# Patient Record
Sex: Female | Born: 1995 | Race: Black or African American | Hispanic: No | Marital: Single | Smoking: Never smoker
Health system: Southern US, Community
[De-identification: ages and names within clinical notes are randomized; demographics above are authoritative.]

## PROBLEM LIST (undated history)

## (undated) ENCOUNTER — Emergency Department (HOSPITAL_COMMUNITY): Payer: BLUE CROSS/BLUE SHIELD

## (undated) DIAGNOSIS — R569 Unspecified convulsions: Secondary | ICD-10-CM

## (undated) DIAGNOSIS — R008 Other abnormalities of heart beat: Secondary | ICD-10-CM

## (undated) DIAGNOSIS — Z889 Allergy status to unspecified drugs, medicaments and biological substances status: Secondary | ICD-10-CM

## (undated) DIAGNOSIS — F32A Depression, unspecified: Secondary | ICD-10-CM

## (undated) DIAGNOSIS — F411 Generalized anxiety disorder: Secondary | ICD-10-CM

## (undated) DIAGNOSIS — F329 Major depressive disorder, single episode, unspecified: Secondary | ICD-10-CM

## (undated) DIAGNOSIS — E785 Hyperlipidemia, unspecified: Secondary | ICD-10-CM

## (undated) DIAGNOSIS — F410 Panic disorder [episodic paroxysmal anxiety] without agoraphobia: Secondary | ICD-10-CM

## (undated) DIAGNOSIS — T7840XA Allergy, unspecified, initial encounter: Secondary | ICD-10-CM

## (undated) DIAGNOSIS — M797 Fibromyalgia: Secondary | ICD-10-CM

## (undated) DIAGNOSIS — F419 Anxiety disorder, unspecified: Secondary | ICD-10-CM

## (undated) DIAGNOSIS — R002 Palpitations: Secondary | ICD-10-CM

## (undated) DIAGNOSIS — J45909 Unspecified asthma, uncomplicated: Secondary | ICD-10-CM

## (undated) HISTORY — DX: Allergy, unspecified, initial encounter: T78.40XA

## (undated) HISTORY — DX: Major depressive disorder, single episode, unspecified: F32.9

## (undated) HISTORY — DX: Anxiety disorder, unspecified: F41.9

## (undated) HISTORY — DX: Unspecified asthma, uncomplicated: J45.909

## (undated) HISTORY — DX: Hyperlipidemia, unspecified: E78.5

## (undated) HISTORY — DX: Depression, unspecified: F32.A

---
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
## 2016-09-09 DIAGNOSIS — F411 Generalized anxiety disorder: Secondary | ICD-10-CM | POA: Insufficient documentation

## 2016-09-24 DIAGNOSIS — F410 Panic disorder [episodic paroxysmal anxiety] without agoraphobia: Secondary | ICD-10-CM | POA: Insufficient documentation

## 2016-12-17 ENCOUNTER — Emergency Department (HOSPITAL_COMMUNITY)
Admission: EM | Admit: 2016-12-17 | Discharge: 2016-12-18 | Disposition: A | Discharge status: Home / Self Care | Payer: 59 | Attending: Emergency Medicine | Admitting: Emergency Medicine

## 2016-12-17 ENCOUNTER — Encounter (HOSPITAL_COMMUNITY): Payer: Self-pay

## 2016-12-17 DIAGNOSIS — R008 Other abnormalities of heart beat: Secondary | ICD-10-CM | POA: Insufficient documentation

## 2016-12-17 DIAGNOSIS — R002 Palpitations: Secondary | ICD-10-CM | POA: Diagnosis present

## 2016-12-17 DIAGNOSIS — Z79899 Other long term (current) drug therapy: Secondary | ICD-10-CM | POA: Diagnosis not present

## 2016-12-17 DIAGNOSIS — R0602 Shortness of breath: Secondary | ICD-10-CM | POA: Diagnosis not present

## 2016-12-17 LAB — RAPID URINE DRUG SCREEN, HOSP PERFORMED
Amphetamines: NOT DETECTED
BENZODIAZEPINES: NOT DETECTED
Barbiturates: NOT DETECTED
COCAINE: NOT DETECTED
Opiates: NOT DETECTED
Tetrahydrocannabinol: NOT DETECTED

## 2016-12-17 LAB — CBC WITH DIFFERENTIAL/PLATELET
BASOS ABS: 0 10*3/uL (ref 0.0–0.1)
Basophils Relative: 0 %
EOS PCT: 3 %
Eosinophils Absolute: 0.2 10*3/uL (ref 0.0–0.7)
HCT: 38.8 % (ref 36.0–46.0)
Hemoglobin: 13.8 g/dL (ref 12.0–15.0)
Lymphocytes Relative: 40 %
Lymphs Abs: 2.9 10*3/uL (ref 0.7–4.0)
MCH: 31.2 pg (ref 26.0–34.0)
MCHC: 35.6 g/dL (ref 30.0–36.0)
MCV: 87.6 fL (ref 78.0–100.0)
MONO ABS: 0.6 10*3/uL (ref 0.1–1.0)
Monocytes Relative: 9 %
Neutro Abs: 3.4 10*3/uL (ref 1.7–7.7)
Neutrophils Relative %: 48 %
PLATELETS: 295 10*3/uL (ref 150–400)
RBC: 4.43 MIL/uL (ref 3.87–5.11)
RDW: 12.6 % (ref 11.5–15.5)
WBC: 7.2 10*3/uL (ref 4.0–10.5)

## 2016-12-17 LAB — PREGNANCY, URINE: PREG TEST UR: NEGATIVE

## 2016-12-17 NOTE — ED Triage Notes (Signed)
Pt brought in by EMS from home pt states " my heart is racing" Per EMS pt denies CP at this time. Pt EKG showed occasional PVC's/NSR. Pt denies SOB     BP 130/82   HR 80 RR 20 O2 Sat 97% RA

## 2016-12-17 NOTE — ED Notes (Signed)
Bed: WA05 Expected date:  Expected time:  Means of arrival:  Comments: ems 

## 2016-12-17 NOTE — ED Notes (Addendum)
Pt reports that she felt that her heart was beating fast and hard for approx. 10 minutes, pt states that she did feel as though her breath was being taken away with symptoms. Pt report  that she was sitting down when she felt the symptoms  Pt states that in the past she has had chest pain. Pt denies N/V/D, and new medications.

## 2016-12-17 NOTE — ED Provider Notes (Signed)
[facility]-EMERGENCY DEPT Provider Note   CSN: [id number] Arrival date & time: 12/17/16  2213  By signing my name below, I, Diona BrownerJennifer Gorman, attest that this documentation has been prepared under the direction and in the presence of Horton, Mayer Maskerourtney F, MD. Electronically Signed: Diona BrownerJennifer Gorman, ED Scribe. 12/17/16. 11:19 PM.  History   Chief Complaint Chief Complaint  Patient presents with  . Palpitations    HPI Kristine Soto is a 21 y.o. female who presents to the Emergency Department complaining of palpitations that started ~ 9 pm. Associated sx include SOB. Pt reports that she still feels like her heart is beating funny. Pt reports she has had CP all her life, but currently denies CP. No new medication. No drug or alcohol use. No increased caffeine intake. Hasn't seen a cardiologist in the past. No hx of blood clots. No recent travel. No chance of being pregnant. Pt takes Citalopram daily.  She doesn't take herbal or dietary supplements. Pt denies CP, nausea, vomiting, and fever.   The history is provided by the patient. No language interpreter was used.    Past Medical History:  Diagnosis Date  . Multiple allergies   . Seizures (HCC)     There are no active problems to display for this patient.   History reviewed. No pertinent surgical history.  OB History    No data available       Home Medications    Prior to Admission medications   Medication Sig Start Date End Date Taking? Authorizing Provider  ALPRAZolam (XANAX) 0.25 MG tablet Take 0.25 mg by mouth daily as needed. 09/26/16  Yes [provider]  citalopram (CELEXA) 10 MG tablet Take 10 mg by mouth daily. 11/24/16  Yes [provider]  albuterol (PROVENTIL HFA;VENTOLIN HFA) 108 (90 Base) MCG/ACT inhaler Inhale 2 puffs into the lungs every 4 (four) hours as needed for shortness of breath.    [provider]  EPINEPHrine (EPIPEN) 0.3 mg/0.3 mL DEVI Inject 0.3 mLs (0.3 mg total) into the muscle as  needed (use as ). 11/30/10   Doug SouJacubowitz, Sam, MD  predniSONE (DELTASONE) 10 MG tablet Take 2 tablets (20 mg total) by mouth daily. Patient not taking: Reported on 12/17/2016 05/02/11   Lorre NickAllen, Anthony, MD    Family History History reviewed. No pertinent family history.  Social History Social History  Substance Use Topics  . Smoking status: Never Smoker  . Smokeless tobacco: Never Used  . Alcohol use No     Allergies   Peanut oil; Penicillins; Egg white  [albumen, egg]; Fish allergy; and Fish-derived products   Review of Systems Review of Systems  Constitutional: Negative for fever.  Respiratory: Positive for shortness of breath.   Cardiovascular: Positive for palpitations. Negative for chest pain and leg swelling.  Gastrointestinal: Negative for nausea and vomiting.  All other systems reviewed and are negative.    Physical Exam Updated Vital Signs BP (!) 141/96 (BP Location: Left Arm)   Pulse 72   Temp 98.2 F (36.8 C) (Oral)   Resp 17   Ht 5\' 6"  (1.676 m)   Wt 81.6 kg (180 lb)   SpO2 99%   BMI 29.05 kg/m   Physical Exam  Constitutional: She is oriented to person, place, and time. She appears well-developed and well-nourished.  Overweight, no acute distress  HENT:  Head: Normocephalic and atraumatic.  Cardiovascular: Regular rhythm and normal heart sounds.   Regularly irregular rhythm  Pulmonary/Chest: Effort normal and breath sounds normal. No respiratory  distress. She has no wheezes.  Abdominal: Soft. There is no tenderness.  Musculoskeletal: She exhibits no edema.  Neurological: She is alert and oriented to person, place, and time.  Skin: Skin is warm and dry.  Psychiatric: She has a normal mood and affect.  Nursing note and vitals reviewed.    ED Treatments / Results  DIAGNOSTIC STUDIES: Oxygen Saturation is 99% on RA, normal by my interpretation.   COORDINATION OF CARE: 11:19 PM-Discussed next steps with pt which includes following up with a  cardiologist. Pt verbalized understanding and is agreeable with the plan.   Labs (all labs ordered are listed, but only abnormal results are displayed) Labs Reviewed  CBC WITH DIFFERENTIAL/PLATELET  BASIC METABOLIC PANEL  MAGNESIUM  RAPID URINE DRUG SCREEN, HOSP PERFORMED  PREGNANCY, URINE    EKG  EKG Interpretation  Date/Time:  Wednesday December 17 2016 22:34:12 EDT Ventricular Rate:  79 PR Interval:    QRS Duration: 85 QT Interval:  371 QTC Calculation: 426 R Axis:   89 Text Interpretation:  Sinus rhythm Ventricular trigeminy Prolonged PR interval No prior for comparison Confirmed by Ross MarcusHorton, Courtney ([id number]) on 12/17/2016 10:57:55 PM       Radiology No results found.  Procedures Procedures (including critical care time)  Medications Ordered in ED Medications - No data to display   Initial Impression / Assessment and Plan / ED Course  I have reviewed the triage vital signs and the nursing notes.  Pertinent labs & imaging results that were available during my care of the patient were reviewed by me and considered in my medical decision making (see chart for details).     Patient presents with palpitations. She is nontoxic-appearing. No associated symptoms. EKG shows trigeminy. Basic labwork obtained. No specific metabolic derangements noted including magnesium. Patient denies any chest pain or shortness of breath. She is PERC negative.  She is likely symptomatic from frequent PVCs. Will provide follow-up for cardiology. This is likely benign. Discussed with patient limiting caffeine intake.  After history, exam, and medical workup I feel the patient has been appropriately medically screened and is safe for discharge home. Pertinent diagnoses were discussed with the patient. Patient was given return precautions.   Final Clinical Impressions(s) / ED Diagnoses   Final diagnoses:  Palpitations  Trigeminy    New Prescriptions New Prescriptions   No medications on file    I personally performed the services described in this documentation, which was scribed in my presence. The recorded information has been reviewed and is accurate.    Shon BatonHorton, Courtney F, MD 12/18/16 [phone]

## 2016-12-18 LAB — BASIC METABOLIC PANEL
ANION GAP: 10 (ref 5–15)
BUN: 13 mg/dL (ref 6–20)
CO2: 24 mmol/L (ref 22–32)
Calcium: 9.2 mg/dL (ref 8.9–10.3)
Chloride: 103 mmol/L (ref 101–111)
Creatinine, Ser: 0.67 mg/dL (ref 0.44–1.00)
GLUCOSE: 80 mg/dL (ref 65–99)
POTASSIUM: 3.7 mmol/L (ref 3.5–5.1)
SODIUM: 137 mmol/L (ref 135–145)

## 2016-12-18 LAB — MAGNESIUM: MAGNESIUM: 1.8 mg/dL (ref 1.7–2.4)

## 2016-12-18 NOTE — Discharge Instructions (Signed)
You were seen today for palpitations. You are in a rhythm call trigeminy. Every third beat is a ventricular beat. Your workup here is reassuring. You need follow-up with cardiology to determine if you need any further workup. If you have any new or worsening symptoms you should be reevaluated.

## 2016-12-19 ENCOUNTER — Ambulatory Visit (INDEPENDENT_AMBULATORY_CARE_PROVIDER_SITE_OTHER): Payer: 59 | Admitting: Cardiology

## 2016-12-19 ENCOUNTER — Encounter: Payer: Self-pay | Admitting: Cardiology

## 2016-12-19 DIAGNOSIS — F419 Anxiety disorder, unspecified: Secondary | ICD-10-CM

## 2016-12-19 DIAGNOSIS — R002 Palpitations: Secondary | ICD-10-CM | POA: Diagnosis not present

## 2016-12-19 DIAGNOSIS — G4733 Obstructive sleep apnea (adult) (pediatric): Secondary | ICD-10-CM | POA: Diagnosis not present

## 2016-12-19 DIAGNOSIS — R0789 Other chest pain: Secondary | ICD-10-CM

## 2016-12-19 NOTE — Patient Instructions (Signed)
Medication Instructions:  Your physician recommends that you continue on your current medications as directed. Please refer to the Current Medication list given to you today.   Labwork: Your physician recommends that you return for lab work in: today. Lipid   Testing/Procedures: Your physician has recommended that you wear a holter monitor. Holter monitors are medical devices that record the heart's electrical activity. Doctors most often use these monitors to diagnose arrhythmias. Arrhythmias are problems with the speed or rhythm of the heartbeat. The monitor is a small, portable device. You can wear one while you do your normal daily activities. This is usually used to diagnose what is causing palpitations/syncope (passing out).  Your physician has requested that you have an echocardiogram. Echocardiography is a painless test that uses sound waves to create images of your heart. It provides your doctor with information about the size and shape of your heart and how well your heart's chambers and valves are working. This procedure takes approximately one hour. There are no restrictions for this procedure.  Your physician has recommended that you have a sleep study. This test records several body functions during sleep, including: brain activity, eye movement, oxygen and carbon dioxide blood levels, heart rate and rhythm, breathing rate and rhythm, the flow of air through your mouth and nose, snoring, body muscle movements, and chest and belly movement.    Follow-Up: Your physician recommends that you schedule a follow-up appointment in: 1 month   Any Other Special Instructions Will Be Listed Below (If Applicable).     If you need a refill on your cardiac medications before your next appointment, please call your pharmacy.

## 2016-12-19 NOTE — Progress Notes (Signed)
Cardiology Consultation:    Date:  12/19/2016   ID:  [name], DOB [birth year], MRN [id number]  PCP:  Laqueta DueFurr, Kristine M., MD  Cardiologist:  Kristine Balsamobert Krasowski, MD   Referring MD: Laqueta DueFurr, Kristine M., MD   Chief Complaint  Patient presents with  . Palpitations  . Chest Pain  Having palpitations  History of Present Illness:    Kristine Soto is a 21 y.o. female who is being seen today for the evaluation of Palpitations at the request of Kristine Soto, Tor NettersSara M., MD. Patient is 21 years old woman who recently end up going to the emergency room because of palpitations. She felt her heart skipping her breath was taking away for a split second. She did have EKG with rhythm strip done there which showed some PVCs. Luckily she has the recording with her.. She said it never had situation like this before. Entire sensation of palpitation lasted for a few hours and went away with no trouble. Since that time she does not have any more palpitations. She does have history of long-standing chest pains. She described the pain as located in the middle of the chest sharp stabbing type lasting for up to 20 seconds not related to exertion. She does not exercise on the radial basis however when she walks and she woke up to 10,000 steps a day she does have no difficulties. Does not have any symptoms while walking. Does not have any family history of premature coronary artery disease, no history of sudden cardiac death in the family.  Past Medical History:  Diagnosis Date  . Multiple allergies   . Seizures (HCC)     History reviewed. No pertinent surgical history.  Current Medications: Current Meds  Medication Sig  . albuterol (PROVENTIL HFA;VENTOLIN HFA) 108 (90 Base) MCG/ACT inhaler Inhale 2 puffs into the lungs every 4 (four) hours as needed for shortness of breath.  . ALPRAZolam (XANAX) 0.25 MG tablet Take 0.25 mg by mouth daily as needed.  . citalopram (CELEXA) 10 MG tablet Take 10 mg by mouth daily.  Marland Kitchen.  EPINEPHrine (EPIPEN) 0.3 mg/0.3 mL DEVI Inject 0.3 mLs (0.3 mg total) into the muscle as needed (use as ).     Allergies:   Peanut oil; Penicillins; Egg white  [albumen, egg]; Fish allergy; and Fish-derived products   Social History   Social History  . Marital status: Single    Spouse name: N/A  . Number of children: N/A  . Years of education: N/A   Social History Main Topics  . Smoking status: Never Smoker  . Smokeless tobacco: Never Used  . Alcohol use No  . Drug use: No  . Sexual activity: Not Asked   Other Topics Concern  . None   Social History Narrative  . None     Family History: The patient's family history includes Hyperlipidemia in her father; Thyroid disease in her mother. ROS:   Please see the history of present illness.    All 14 point review of systems negative except as described per history of present illness.  EKGs/Labs/Other Studies Reviewed:    The following studies were reviewed today: EKG done in the emergency room which showed normal sinus rhythm, normal. Interval, normal., Some PVCs, nonspecific ST segment changes.  Recent Labs: 12/17/2016: BUN 13; Creatinine, Ser 0.67; Hemoglobin 13.8; Magnesium 1.8; Platelets 295; Potassium 3.7; Sodium 137  Recent Lipid Panel No results found for: CHOL, TRIG, HDL, CHOLHDL, VLDL, LDLCALC, LDLDIRECT  Physical Exam:    VS:  BP 106/70   Pulse 80   Resp 10   Ht 5\' 6"  (1.676 m)   Wt 183 lb (83 kg)   BMI 29.54 kg/m     Wt Readings from Last 3 Encounters:  12/19/16 183 lb (83 kg)  12/17/16 180 lb (81.6 kg) (94 %, Z= 1.59)*  05/02/11 166 lb (75.3 kg) (96 %, Z= 1.72)*   * Growth percentiles are based on CDC 2-20 Years data.     GEN:  Well nourished, well developed in no acute distress HEENT: Normal NECK: No JVD; No carotid bruits LYMPHATICS: No lymphadenopathy CARDIAC: RRR, no murmurs, no rubs, no gallops RESPIRATORY:  Clear to auscultation without rales, wheezing or rhonchi  ABDOMEN: Soft, non-tender,  non-distended MUSCULOSKELETAL:  No edema; No deformity  SKIN: Warm and dry NEUROLOGIC:  Alert and oriented x 3 PSYCHIATRIC:  Normal affect   ASSESSMENT:    1. Palpitations   2. Atypical chest pain   3. Obstructive sleep apnea   4. Anxiety    PLAN:    In order of problems listed above:  1. Palpitations: Documented by EKG showing some PVCs. I will ask her to wear Holter monitor would like to know if she has any more palpitations all may be more worrisome arrhythmia. As a part of her evaluation and stratification of this arrhythmia I will ask her to have echo to check left ventricular ejection fraction. 2. Atypical chest pain: Does not have any characteristic of cardiac pain. She does not have risk factors for having coronary artery disease therefore I will not investigate his right now however this is the issue that we'll do this next time when she be here. 3. Snoring with signs and symptoms of obstructive sleep apnea. She agreed to have sleep study which we will do. 4. Anxiety on medication stable. 5. Supportable risk factors assessment I will ask her to have fasting lipid profile.   Medication Adjustments/Labs and Tests Ordered: Current medicines are reviewed at length with the patient today.  Concerns regarding medicines are outlined above.  No orders of the defined types were placed in this encounter.  No orders of the defined types were placed in this encounter.   Signed, Georgeanna Leaobert J. Krasowski, MD, [facility] HospitalFACC. 12/19/2016 11:16 AM    [facility]

## 2016-12-20 LAB — LIPID PANEL
CHOLESTEROL TOTAL: 193 mg/dL (ref 100–199)
Chol/HDL Ratio: 4.1 ratio (ref 0.0–4.4)
HDL: 47 mg/dL (ref >39)
LDL Calculated: 138 mg/dL — ABNORMAL HIGH (ref 0–99)
Triglycerides: 40 mg/dL (ref 0–149)
VLDL Cholesterol Cal: 8 mg/dL (ref 5–40)

## 2016-12-31 ENCOUNTER — Ambulatory Visit (INDEPENDENT_AMBULATORY_CARE_PROVIDER_SITE_OTHER): Payer: 59

## 2016-12-31 ENCOUNTER — Ambulatory Visit (HOSPITAL_COMMUNITY): Payer: 59 | Attending: Cardiology

## 2016-12-31 ENCOUNTER — Other Ambulatory Visit: Payer: Self-pay

## 2016-12-31 DIAGNOSIS — R002 Palpitations: Secondary | ICD-10-CM

## 2016-12-31 DIAGNOSIS — F419 Anxiety disorder, unspecified: Secondary | ICD-10-CM | POA: Diagnosis not present

## 2016-12-31 DIAGNOSIS — G4733 Obstructive sleep apnea (adult) (pediatric): Secondary | ICD-10-CM

## 2016-12-31 DIAGNOSIS — I088 Other rheumatic multiple valve diseases: Secondary | ICD-10-CM | POA: Diagnosis not present

## 2016-12-31 DIAGNOSIS — R0789 Other chest pain: Secondary | ICD-10-CM

## 2017-01-01 LAB — ECHOCARDIOGRAM COMPLETE
Area-P 1/2: 7.1 cm2
CHL CUP MV DEC (S): 106
E/e' ratio: 3.8
EWDT: 106 ms
FS: 33 % (ref 28–44)
IV/PV OW: 0.73
LA diam end sys: 30 mm
LADIAMINDEX: 1.51 cm/m2
LASIZE: 30 mm
LAVOL: 28.4 mL
LAVOLA4C: 24 mL
LAVOLIN: 14.3 mL/m2
LV E/e' medial: 3.8
LV E/e'average: 3.8
LV PW d: 11 mm — AB (ref 0.6–1.1)
LV TDI E'LATERAL: 23.1
LVELAT: 23.1 cm/s
LVOT VTI: 14.5 cm
LVOT area: 3.14 cm2
LVOT peak vel: 73.2 cm/s
LVOTD: 20 mm
LVOTSV: 46 mL
Lateral S' vel: 13.1 cm/s
MV Peak grad: 3 mmHg
MV pk A vel: 82 m/s
MVPKEVEL: 87.8 m/s
MVSPHT: 31 ms
TAPSE: 17.9 mm
TDI e' medial: 13.3

## 2017-01-19 ENCOUNTER — Ambulatory Visit: Payer: 59 | Admitting: Cardiology

## 2017-01-20 ENCOUNTER — Ambulatory Visit: Payer: 59 | Admitting: Cardiology

## 2017-02-11 ENCOUNTER — Emergency Department (HOSPITAL_BASED_OUTPATIENT_CLINIC_OR_DEPARTMENT_OTHER)
Admission: EM | Admit: 2017-02-11 | Discharge: 2017-02-11 | Disposition: A | Discharge status: Home / Self Care | Payer: 59 | Attending: Emergency Medicine | Admitting: Emergency Medicine

## 2017-02-11 ENCOUNTER — Encounter (HOSPITAL_BASED_OUTPATIENT_CLINIC_OR_DEPARTMENT_OTHER): Payer: Self-pay

## 2017-02-11 ENCOUNTER — Emergency Department (HOSPITAL_BASED_OUTPATIENT_CLINIC_OR_DEPARTMENT_OTHER): Payer: 59

## 2017-02-11 DIAGNOSIS — Z79899 Other long term (current) drug therapy: Secondary | ICD-10-CM | POA: Diagnosis not present

## 2017-02-11 DIAGNOSIS — B9689 Other specified bacterial agents as the cause of diseases classified elsewhere: Secondary | ICD-10-CM

## 2017-02-11 DIAGNOSIS — Z91010 Allergy to peanuts: Secondary | ICD-10-CM | POA: Insufficient documentation

## 2017-02-11 DIAGNOSIS — M545 Low back pain, unspecified: Secondary | ICD-10-CM

## 2017-02-11 DIAGNOSIS — N760 Acute vaginitis: Secondary | ICD-10-CM | POA: Diagnosis not present

## 2017-02-11 HISTORY — DX: Fibromyalgia: M79.7

## 2017-02-11 LAB — COMPREHENSIVE METABOLIC PANEL
ALK PHOS: 58 U/L (ref 38–126)
ALT: 21 U/L (ref 14–54)
AST: 21 U/L (ref 15–41)
Albumin: 3.6 g/dL (ref 3.5–5.0)
Anion gap: 6 (ref 5–15)
BUN: 8 mg/dL (ref 6–20)
CALCIUM: 9 mg/dL (ref 8.9–10.3)
CHLORIDE: 105 mmol/L (ref 101–111)
CO2: 28 mmol/L (ref 22–32)
CREATININE: 0.68 mg/dL (ref 0.44–1.00)
GFR calc Af Amer: >60 mL/min (ref >60)
Glucose, Bld: 95 mg/dL (ref 65–99)
Potassium: 3.8 mmol/L (ref 3.5–5.1)
Sodium: 139 mmol/L (ref 135–145)
Total Bilirubin: 0.6 mg/dL (ref 0.3–1.2)
Total Protein: 7.2 g/dL (ref 6.5–8.1)

## 2017-02-11 LAB — WET PREP, GENITAL
SPERM: NONE SEEN
Trich, Wet Prep: NONE SEEN
YEAST WET PREP: NONE SEEN

## 2017-02-11 LAB — URINALYSIS, ROUTINE W REFLEX MICROSCOPIC
Bilirubin Urine: NEGATIVE
GLUCOSE, UA: NEGATIVE mg/dL
HGB URINE DIPSTICK: NEGATIVE
Ketones, ur: NEGATIVE mg/dL
Leukocytes, UA: NEGATIVE
Nitrite: NEGATIVE
PH: 6 (ref 5.0–8.0)
Protein, ur: NEGATIVE mg/dL
SPECIFIC GRAVITY, URINE: 1.02 (ref 1.005–1.030)

## 2017-02-11 LAB — CBC WITH DIFFERENTIAL/PLATELET
BASOS ABS: 0 10*3/uL (ref 0.0–0.1)
BASOS PCT: 0 %
Eosinophils Absolute: 0.2 10*3/uL (ref 0.0–0.7)
Eosinophils Relative: 4 %
HEMATOCRIT: 38 % (ref 36.0–46.0)
Hemoglobin: 13 g/dL (ref 12.0–15.0)
Lymphocytes Relative: 39 %
Lymphs Abs: 2 10*3/uL (ref 0.7–4.0)
MCH: 31.3 pg (ref 26.0–34.0)
MCHC: 34.2 g/dL (ref 30.0–36.0)
MCV: 91.6 fL (ref 78.0–100.0)
MONO ABS: 0.5 10*3/uL (ref 0.1–1.0)
MONOS PCT: 10 %
NEUTROS PCT: 47 %
Neutro Abs: 2.5 10*3/uL (ref 1.7–7.7)
Platelets: 270 10*3/uL (ref 150–400)
RBC: 4.15 MIL/uL (ref 3.87–5.11)
RDW: 12.4 % (ref 11.5–15.5)
WBC: 5.2 10*3/uL (ref 4.0–10.5)

## 2017-02-11 LAB — PREGNANCY, URINE: PREG TEST UR: NEGATIVE

## 2017-02-11 LAB — LIPASE, BLOOD: Lipase: 29 U/L (ref 11–51)

## 2017-02-11 MED ORDER — IBUPROFEN 400 MG PO TABS
600.0000 mg | ORAL_TABLET | Freq: Once | ORAL | Status: DC
  Filled 2017-02-11: qty 1

## 2017-02-11 MED ORDER — ACETAMINOPHEN 500 MG PO TABS
1000.0000 mg | ORAL_TABLET | Freq: Once | ORAL | Status: AC
  Administered 2017-02-11: 1000 mg via ORAL
  Filled 2017-02-11: qty 2

## 2017-02-11 MED ORDER — METRONIDAZOLE 500 MG PO TABS: 500.0000 mg | ORAL_TABLET | Freq: Two times a day (BID) | ORAL | 0 refills | Status: AC

## 2017-02-11 NOTE — ED Provider Notes (Signed)
Received transfer of care at end of shift from [facility] PA-C pending pelvic ultrasound. She presented with bilateral lower back pain radiating to the flank and suprapubic discomfort. Negative urine, tender to palpation over the lower lumbar musculature. Labs unremarkable. Plan to discharge home with current home regiment of muscle relaxer and NSAIDs as well as warm compresses if ultrasound negative.  Wet prep returned positive for bacterial vaginosis. Will treat with Flagyl for 7 days. Advised to avoid alcohol while taking this medication.  Normal ultrasound. Overall reassuring workup. Discharge with GYN and PCP follow up. Discussed strict return precautions and advised to return to the emergency department if experiencing any new or worsening symptoms. Instructions were understood and patient agreed with discharge plan.    Georgiana Shore, PA-C 02/11/17 1836    Rolland Porter, MD 02/22/17 2102

## 2017-02-11 NOTE — ED Triage Notes (Signed)
C/o lower back pain, nausea-started yesterday-NAD-steady gait

## 2017-02-11 NOTE — ED Provider Notes (Signed)
[facility] Provider Note   CSN: [id number] Arrival date & time: 02/11/17  1424     History   Chief Complaint Chief Complaint  Patient presents with  . Back Pain    HPI Kristine Soto is a 21 y.o. female who presents with 2 days of intermittent lower back pain. Patient reports that she will have back pain in the lower back that radiates to both sides bilaterally into the front of her abdomen. Patient describes it as a severe pain. She reports that the pain lasts approximately 20-30 minutes before resolving on its own. She does not notice any exacerbating or inciting factor. Patient says she has this pain regardless if she is walking or if she is at rest. Patient has taken her normal pain medications with no improvement in pain. Patient reports associated nausea when she has the pain. Her LMP was August 20. Patient denies any preceding trauma, injury, fall. Denies fevers, weight loss, numbness/weakness of upper and lower extremities, bowel/bladder incontinence, saddle anesthesia, history of back surgery, history of IVDA.  Patient denies chest pain, difficult breathing, dysuria, hematuria, vomiting, vaginal bleeding. Patient reports she is currently not sexually active.  The history is provided by the patient.    Past Medical History:  Diagnosis Date  . Fibromyalgia   . Multiple allergies   . Seizures [facility])     Patient Active Problem List   Diagnosis Date Noted  . Palpitations 12/19/2016  . Atypical chest pain 12/19/2016  . Obstructive sleep apnea 12/19/2016  . Anxiety 12/19/2016    History reviewed. No pertinent surgical history.  OB History    No data available       Home Medications    Prior to Admission medications   Medication Sig Start Date End Date Taking? Authorizing Provider  ALPRAZolam (XANAX) 0.25 MG tablet Take 0.25 mg by mouth daily as needed. 09/26/16   [provider]  citalopram (CELEXA) 10 MG tablet Take 10 mg by mouth daily. 11/24/16    [provider]  metroNIDAZOLE (FLAGYL) 500 MG tablet Take 1 tablet (500 mg total) by mouth 2 (two) times daily. 02/11/17 02/18/17  Georgiana Shore, PA-C    Family History Family History  Problem Relation Age of Onset  . Thyroid disease Mother   . Hyperlipidemia Father     Social History Social History  Substance Use Topics  . Smoking status: Never Smoker  . Smokeless tobacco: Never Used  . Alcohol use No     Allergies   Peanut oil; Penicillins; Egg white  [albumen, egg]; Fish allergy; and Fish-derived products   Review of Systems Review of Systems  Constitutional: Negative for fever.  Respiratory: Negative for cough and shortness of breath.   Cardiovascular: Negative for chest pain.  Gastrointestinal: Positive for abdominal pain and nausea. Negative for vomiting.  Genitourinary: Negative for dysuria, hematuria and vaginal bleeding.  Musculoskeletal: Positive for back pain.  Neurological: Negative for headaches.     Physical Exam Updated Vital Signs BP 112/73 (BP Location: Right Arm)   Pulse 69   Temp 99 F (37.2 C) (Oral)   Resp 16   Ht  (1.676 m)   Wt 82.6 kg (182 lb)   LMP 01/17/2017   SpO2 100%   BMI 29.38 kg/m   Physical Exam  Constitutional: She is oriented to person, place, and time. She appears well-developed and well-nourished.  Sitting comfortably on examination table  HENT:  Head: Normocephalic and atraumatic.  Mouth/Throat: Oropharynx is clear  and moist and mucous membranes are normal.  Eyes: Pupils are equal, round, and reactive to light. Conjunctivae, EOM and lids are normal.  Neck: Full passive range of motion without pain.  Full flexion/extension and lateral movement of neck fully intact. No bony midline tenderness. No deformities or crepitus.   Cardiovascular: Normal rate, regular rhythm, normal heart sounds and normal pulses.  Exam reveals no gallop and no friction rub.   No murmur heard. Pulmonary/Chest: Effort normal  and breath sounds normal.  Abdominal: Soft. Normal appearance. There is tenderness in the suprapubic area. There is no rigidity, no guarding, no CVA tenderness and no tenderness at McBurney's point.  Abdomen soft, nondistended. She has very mild suprapubic abdominal tenderness. No CVA tenderness bilaterally. No rigidity, guarding.  Genitourinary: Vagina normal and uterus normal. Cervix exhibits discharge. Cervix exhibits no motion tenderness and no friability. Right adnexum displays no mass and no tenderness. Left adnexum displays no mass and no tenderness. No bleeding in the vagina.  Genitourinary Comments: The exam was performed with a chaperone present. Normal external female genitalia. No lesions, rash, or sores. Small amount of milky, white, malodorous discharge. No CMT. No cervical friability. No adnexal mass or tenderness bilaterally. Uterus normal.  Musculoskeletal: Normal range of motion.       Thoracic back: She exhibits no tenderness.  Diffuse muscular tenderness overlying the entire lumbar region, including bilateral paraspinal muscles that extends over the midline. No deformity or crepitus noted.  Neurological: She is alert and oriented to person, place, and time.  Follows commands, Moves all extremities  5/5 strength to BUE and BLE  Sensation intact throughout all major nerve distributions  Skin: Skin is warm and dry. Capillary refill takes less than 2 seconds.  Psychiatric: She has a normal mood and affect. Her speech is normal.  Nursing note and vitals reviewed.    ED Treatments / Results  Labs (all labs ordered are listed, but only abnormal results are displayed) Labs Reviewed  WET PREP, GENITAL - Abnormal; Notable for the following:       Result Value   Clue Cells Wet Prep HPF POC PRESENT (*)    WBC, Wet Prep HPF POC FEW (*)    All other components within normal limits  URINALYSIS, ROUTINE W REFLEX MICROSCOPIC  PREGNANCY, URINE  COMPREHENSIVE METABOLIC PANEL  CBC WITH  DIFFERENTIAL/PLATELET  LIPASE, BLOOD  GC/CHLAMYDIA PROBE AMP ([place]) NOT AT [facility]    EKG  EKG Interpretation None       Radiology No results found.  Procedures Procedures (including critical care time)  Medications Ordered in ED Medications  acetaminophen (TYLENOL) tablet 1,000 mg (1,000 mg Oral Given 02/11/17 1632)     Initial Impression / Assessment and Plan / ED Course  I have reviewed the triage vital signs and the nursing notes.  Pertinent labs & imaging results that were available during my care of the patient were reviewed by me and considered in my medical decision making (see chart for details).     21 year old female who presents with 2 days of intermittent back pain that extends to the front abdomen. Associated with nausea. Patient is afebrile, non-toxic appearing, sitting comfortably on examination table. Vital signs reviewed and stable. Physical exam shows some mild suprapubic abdominal tenderness and diffuse muscular tenderness overlying the entire lumbar region. No CVA tenderness bilaterally. Consider UTI versus musculoskeletal pain versus ovarian cysts. History/physical exam are not concerning for appendicitis or diverticulitis, pyelonephritis, ovarian torsion. Low suspicion for kidney stone, though a  consideration. Urine ordered at triage. Will plan to obtain additional labs.   Labs reviewed. CBC is unremarkable. Lipase unremarkable. CMP unremarkable. Urine is negative for any acute signs of infection. Urine pregnancy is negative. Discussed results with patient and mom. Explained that pain could likely be musculoskeletal or PMS as patient is due to get her period in the next few days. Mom is very concerned about not getting imaging here in the department and would like a CT scan of the abd/pelvis for further answers and evaluation. Discussed at length with mom and patient regarding CT imaging. Given unremarkable labs, history/physical, presentation, do not feel  like CT abdomen/pelvis is warranted in this case. Patient has some mild suprapubic abdominal tenderness but otherwise no McBurney's point tenderness. No peritoneal signs. Offered to do a pelvic exam for further evaluation and decide if patient needs further ultrasound imaging. Mom and patient agreed.  Pelvic exam performed as documented above. No CMT, adnexal mass or tenderness. Exam not concerning for PID. Patient did have some white discharge. Wet prep and GC chlamydia sent.   Wet prep is positive for BV. Discussed results with mom and patient. Explained that symptoms could likely be result of BV infection. Also explained that as we discussed earlier, could likely be musculoskeletal in nature. At this time, there is low suspicion for any acute intra-abdominal abnormality. Explained That we could treat the BV and muscular discomfort pain have patient follow-up with her primary care doctor. Mom still insisted that we get ultrasound imaging for further evaluation.  Patient signed out to Mathews Robinsons, PA-C with U/S evaluation pending. Plan to discharge home with conservative measures. Instructed patient follow-up with her primary care doctor next 24-48 hours.     Final Clinical Impressions(s) / ED Diagnoses   Final diagnoses:  BV (bacterial vaginosis)  Acute bilateral low back pain without sciatica    New Prescriptions New Prescriptions   METRONIDAZOLE (FLAGYL) 500 MG TABLET    Take 1 tablet (500 mg total) by mouth 2 (two) times daily.     Maxwell Caul, PA-C 02/12/17 1656    Rolland Porter, MD 02/24/17 2026

## 2017-02-11 NOTE — Discharge Instructions (Addendum)
Take Flagyl as directed.  It is very important that you do not consume any alcohol while taking this medication as it will cause you to become violently ill.  You can use your normal pain medications or take tylenol to help with the back pain. Also apply warm compresses to the area to help with pain.  Follow-up with her primary care doctor in the next 24-48 hours for further evaluation.  Return to the Emergency Department immediately for any worsening back pain, neck pain, difficulty walking, numbness/weaknss of your arms or legs, urinary or bowel accidents, fever or any other worsening or concerning symptoms.

## 2017-02-12 LAB — GC/CHLAMYDIA PROBE AMP (~~LOC~~) NOT AT ARMC
CHLAMYDIA, DNA PROBE: NEGATIVE
NEISSERIA GONORRHEA: NEGATIVE

## 2017-02-15 ENCOUNTER — Ambulatory Visit (HOSPITAL_BASED_OUTPATIENT_CLINIC_OR_DEPARTMENT_OTHER): Payer: Self-pay

## 2017-02-17 ENCOUNTER — Telehealth: Payer: Self-pay | Admitting: *Deleted

## 2017-02-17 DIAGNOSIS — M797 Fibromyalgia: Secondary | ICD-10-CM | POA: Insufficient documentation

## 2017-02-17 NOTE — Telephone Encounter (Signed)
-----   Message from Haywood Filler sent at 02/13/2017  4:01 PM EDT ----- Regarding: Reschedule sleep study Just received pt's insurance info this afternoon and unable to obtain auth due to website issues. I spoke with patient and she knows someone will contact her to reschedule.

## 2017-02-17 NOTE — Telephone Encounter (Signed)
Patient was rescheduled to March 29 2017. Patient was rescheduled with the sleep lab. Patient understands her sleep study will be done at [facility] sleep lab. Patient understands she will receive a sleep packet in a week or so. Patient understands to call if she does not receive the sleep packet in a timely manner. Patient agrees with treatment and thanked me for call.

## 2017-02-21 ENCOUNTER — Emergency Department (HOSPITAL_BASED_OUTPATIENT_CLINIC_OR_DEPARTMENT_OTHER)
Admission: EM | Admit: 2017-02-21 | Discharge: 2017-02-21 | Disposition: A | Discharge status: Home / Self Care | Payer: 59 | Attending: Emergency Medicine | Admitting: Emergency Medicine

## 2017-02-21 ENCOUNTER — Encounter (HOSPITAL_BASED_OUTPATIENT_CLINIC_OR_DEPARTMENT_OTHER): Payer: Self-pay | Admitting: *Deleted

## 2017-02-21 DIAGNOSIS — Z79899 Other long term (current) drug therapy: Secondary | ICD-10-CM | POA: Insufficient documentation

## 2017-02-21 DIAGNOSIS — R002 Palpitations: Secondary | ICD-10-CM | POA: Insufficient documentation

## 2017-02-21 DIAGNOSIS — Z91010 Allergy to peanuts: Secondary | ICD-10-CM | POA: Diagnosis not present

## 2017-02-21 HISTORY — DX: Palpitations: R00.2

## 2017-02-21 HISTORY — DX: Other abnormalities of heart beat: R00.8

## 2017-02-21 MED ORDER — ACETAMINOPHEN 500 MG PO TABS
1000.0000 mg | ORAL_TABLET | Freq: Once | ORAL | Status: AC
  Administered 2017-02-21: 1000 mg via ORAL
  Filled 2017-02-21: qty 2

## 2017-02-21 NOTE — ED Provider Notes (Signed)
[facility] Note   CSN: [id number] Arrival date & time: 02/21/17  0041     History   Chief Complaint Chief Complaint  Patient presents with  . Palpitations    HPI Kristine Soto is a 21 y.o. female.  The history is provided by the patient.  Palpitations   This is a recurrent problem. The current episode started 3 to 5 hours ago. The problem occurs constantly. The problem has not changed since onset.The problem is associated with an unknown factor. Pertinent negatives include no diaphoresis, no fever, no malaise/fatigue, no numbness, no chest pain, no chest pressure, no claudication, no exertional chest pressure, no irregular heartbeat, no near-syncope, no orthopnea, no PND, no syncope, no abdominal pain, no nausea, no vomiting, no headaches, no back pain, no leg pain, no lower extremity edema, no dizziness, no weakness, no cough, no hemoptysis, no shortness of breath and no sputum production. She has tried nothing for the symptoms. The treatment provided no relief. Her past medical history does not include valve disorder.    Past Medical History:  Diagnosis Date  . Fibromyalgia   . Multiple allergies   . Palpitations   . Seizures (HCC)   . Trigeminy     Patient Active Problem List   Diagnosis Date Noted  . Palpitations 12/19/2016  . Atypical chest pain 12/19/2016  . Obstructive sleep apnea 12/19/2016  . Anxiety 12/19/2016    History reviewed. No pertinent surgical history.  OB History    No data available       Home Medications    Prior to Admission medications   Medication Sig Start Date End Date Taking? Authorizing Provider  ALPRAZolam (XANAX) 0.25 MG tablet Take 0.25 mg by mouth daily as needed. 09/26/16   [provider]  citalopram (CELEXA) 10 MG tablet Take 10 mg by mouth daily. 11/24/16   [provider]    Family History Family History  Problem Relation Age of Onset  . Thyroid disease Mother   . Hyperlipidemia  Father     Social History Social History  Substance Use Topics  . Smoking status: Never Smoker  . Smokeless tobacco: Never Used  . Alcohol use No     Allergies   Peanut oil; Penicillins; Egg white  [albumen, egg]; Fish allergy; and Fish-derived products   Review of Systems Review of Systems  Constitutional: Negative for diaphoresis, fever and malaise/fatigue.  Respiratory: Negative for cough, hemoptysis, sputum production, chest tightness and shortness of breath.   Cardiovascular: Positive for palpitations. Negative for chest pain, orthopnea, claudication, leg swelling, syncope, PND and near-syncope.  Gastrointestinal: Negative for abdominal pain, nausea and vomiting.  Musculoskeletal: Negative for back pain.  Neurological: Negative for dizziness, weakness, numbness and headaches.  All other systems reviewed and are negative.    Physical Exam Updated Vital Signs BP 112/70   Pulse 86   Temp 99.6 F (37.6 C) (Oral)   Resp 15   Ht  (1.676 m)   Wt 83 kg (183 lb)   LMP 01/15/2017   SpO2 100%   BMI 29.54 kg/m   Physical Exam  Constitutional: She is oriented to person, place, and time. She appears well-developed and well-nourished. No distress.  HENT:  Head: Normocephalic and atraumatic.  Mouth/Throat: No oropharyngeal exudate.  Eyes: Pupils are equal, round, and reactive to light. Conjunctivae are normal.  Neck: Normal range of motion. Neck supple.  Cardiovascular: Normal rate, regular rhythm, normal heart sounds and intact distal pulses.  Pulmonary/Chest: Effort normal and breath sounds normal.  Abdominal: Soft. There is no tenderness. There is no rebound and no guarding.  Musculoskeletal: Normal range of motion. She exhibits no tenderness.  Neurological: She is alert and oriented to person, place, and time.  Skin: Skin is warm and dry. Capillary refill takes less than 2 seconds.  Psychiatric: She has a normal mood and affect.     ED Treatments / Results    Vitals:   02/21/17 0300 02/21/17 0357  BP: 112/70 103/62  Pulse: 86 74  Resp: 15 20  Temp:    SpO2: 100% 100%    EKG  EKG Interpretation  Date/Time:  Saturday February 21 2017 01:01:06 EDT Ventricular Rate:  83 PR Interval:  162 QRS Duration: 80 QT Interval:  342 QTC Calculation: 401 R Axis:   73 Text Interpretation:  Normal sinus rhythm Confirmed by Nicanor Alcon, Fiona Coto ([id number]) on 02/21/2017 1:05:42 AM       Radiology No results found.  Procedures Procedures (including critical care time)  Medications Ordered in ED Medications  acetaminophen (TYLENOL) tablet 1,000 mg (not administered)     Final Clinical Impressions(s) / ED Diagnoses   Patient is feeling symptoms but there was no ectopy on the monitor.  She is PERC negative wells 0 and I highly doubt PE.  She was seen recently for same and had full labs.  She had trigeminy at the time but again there has been none on the monitor today.    Shortness of breath, swelling or the lips or tongue, chest pain, dyspnea on exertion, new weakness or numbness changes in vision or speech,  Inability to tolerate liquids or food, changes in voice cough, altered mental status or any concerns. No signs of systemic illness or infection. The patient is nontoxic-appearing on exam and vital signs are within normal limits.    I have reviewed the triage vital signs and the nursing notes. Pertinent labs &imaging results that were available during my care of the patient were reviewed by me and considered in my medical decision making (see chart for details).  After history, exam, and medical workup I feel the patient has been appropriately medically screened and is safe for discharge home. Pertinent diagnoses were discussed with the patient. Patient was given return precautions.          Dalynn Jhaveri, MD 02/21/17 [id number]

## 2017-02-21 NOTE — ED Notes (Addendum)
Here for palpitations ealier, resolved, associated with chest tightness, sob, panic, (denies: triggers, cough, cold sx, sob, NVD, recent illness, bleeding, or dizziness), h/o same, admitted recently for trigemminy, mentions cardiology f/u with 2D echo, and holter, no meds prescribed for this dx. No meds PTA  Alert, NAD, calm, interactive, resps e/u, speaking in clear complete sentences, no dyspnea noted, skin W&D.  Delay with EKG, pt requesting female staff.

## 2017-02-23 ENCOUNTER — Ambulatory Visit (INDEPENDENT_AMBULATORY_CARE_PROVIDER_SITE_OTHER): Payer: 59 | Admitting: Cardiology

## 2017-02-23 ENCOUNTER — Encounter: Payer: Self-pay | Admitting: Cardiology

## 2017-02-23 ENCOUNTER — Telehealth: Payer: Self-pay | Admitting: Cardiology

## 2017-02-23 VITALS — BP 120/80 | HR 96 | Resp 12 | Ht 66.0 in | Wt 192.1 lb

## 2017-02-23 DIAGNOSIS — F419 Anxiety disorder, unspecified: Secondary | ICD-10-CM

## 2017-02-23 DIAGNOSIS — R002 Palpitations: Secondary | ICD-10-CM | POA: Diagnosis not present

## 2017-02-23 DIAGNOSIS — R0789 Other chest pain: Secondary | ICD-10-CM

## 2017-02-23 MED ORDER — METOPROLOL TARTRATE 25 MG PO TABS: 12.5000 mg | ORAL_TABLET | Freq: Two times a day (BID) | ORAL | 3 refills | Status: DC | PRN

## 2017-02-23 NOTE — Telephone Encounter (Signed)
Pt c/o of Chest Pain: STAT if CP now or developed within 24 hours  1. Are you having CP right now? No  2. Are you experiencing any other symptoms (ex. SOB, nausea, vomiting, sweating)? No  3. How long have you been experiencing CP? Today   4. Is your CP continuous or coming and going? Coming and going   5. Have you taken Nitroglycerin? Does not have any   Appointment arranged for pt to come in today for evaluation.  ?

## 2017-02-23 NOTE — Telephone Encounter (Signed)
New message   Sent to hp to finish getting info  Pt c/o of Chest Pain: STAT if CP now or developed within 24 hours  1. Are you having CP right now? Yes   2. Are you experiencing any other symptoms (ex. SOB, nausea, vomiting, sweating)?   3. How long have you been experiencing CP?   4. Is your CP continuous or coming and going?   5. Have you taken Nitroglycerin?  ?

## 2017-02-23 NOTE — Patient Instructions (Signed)
Medication Instructions:  Your physician has recommended you make the following change in your medication: Metoprolol 25 mg, Take 1 half of a tablet two times a day as need for chest pain/ palpitation  Labwork: None ordered  Testing/Procedures: None ordered  Follow-Up: Your physician recommends that you schedule a follow-up appointment in: 3 month follow up with Dr. Bing Matter   Any Other Special Instructions Will Be Listed Below (If Applicable).     If you need a refill on your cardiac medications before your next appointment, please call your pharmacy.

## 2017-02-23 NOTE — Progress Notes (Signed)
Cardiology Office Note:    Date:  02/23/2017   ID:  Kristine Soto, DOB Dec 27, 1995, MRN [id number]  PCP:  Laqueta Due., MD  Cardiologist:  Gypsy Balsam, MD    Referring MD: Laqueta Due., MD   Chief Complaint  Patient presents with  . Chest Pain  . Palpitations  Still having some palpitations and atypical chest pain  History of Present Illness:    Kristine Soto is a 21 y.o. female  with atypical chest pain and palpitations. She described her heart flip flopping and skipping sometimes on top of that she described sharp stabbing like chest pain not related to exertion. She goes to gym and regular basis and has no difficulty doing that.  Past Medical History:  Diagnosis Date  . Fibromyalgia   . Multiple allergies   . Palpitations   . Seizures (HCC)   . Trigeminy     No past surgical history on file.  Current Medications: Current Meds  Medication Sig  . Vitamin D, Ergocalciferol, (DRISDOL) 50000 units CAPS capsule Take 1 capsule by mouth once a week.     Allergies:   Peanut oil; Penicillins; Egg white  [albumen, egg]; Fish allergy; and Fish-derived products   Social History   Social History  . Marital status: Single    Spouse name: N/A  . Number of children: N/A  . Years of education: N/A   Social History Main Topics  . Smoking status: Never Smoker  . Smokeless tobacco: Never Used  . Alcohol use No  . Drug use: No  . Sexual activity: No   Other Topics Concern  . None   Social History Narrative  . None     Family History: The patient's family history includes Hyperlipidemia in her father; Thyroid disease in her mother. ROS:   Please see the history of present illness.    All 14 point review of systems negative except as described per history of present illness  EKGs/Labs/Other Studies Reviewed:      Recent Labs: 12/17/2016: Magnesium 1.8 02/11/2017: ALT 21; BUN 8; Creatinine, Ser 0.68; Hemoglobin 13.0; Platelets 270; Potassium 3.8; Sodium 139    Recent Lipid Panel    Component Value Date/Time   CHOL 193 12/19/2016 1154   TRIG 40 12/19/2016 1154   HDL 47 12/19/2016 1154   CHOLHDL 4.1 12/19/2016 1154   LDLCALC 138 (H) 12/19/2016 1154    Physical Exam:    VS:  BP 120/80   Pulse 96   Resp 12   Ht  (1.676 m)   Wt 192 lb 1.9 oz (87.1 kg)   BMI 31.01 kg/m     Wt Readings from Last 3 Encounters:  02/23/17 192 lb 1.9 oz (87.1 kg)  02/21/17 183 lb (83 kg)  02/11/17 182 lb (82.6 kg)     GEN:  Well nourished, well developed in no acute distress HEENT: Normal NECK: No JVD; No carotid bruits LYMPHATICS: No lymphadenopathy CARDIAC: RRR, no murmurs, no rubs, no gallops RESPIRATORY:  Clear to auscultation without rales, wheezing or rhonchi  ABDOMEN: Soft, non-tender, non-distended MUSCULOSKELETAL:  No edema; No deformity  SKIN: Warm and dry LOWER EXTREMITIES: no swelling NEUROLOGIC:  Alert and oriented x 3 PSYCHIATRIC:  Normal affect   ASSESSMENT:    1. Anxiety   2. Atypical chest pain   3. Palpitations    PLAN:    In order of problems listed above:  1. Anxiety: Follow-up a psychotherapist. 2. Atypical chest pain happening at rest  stabbing not related to exercise. Will try small dose of beta blocker to see if it helps. 3. Palpitations: Will try beta blockers if it helps with palpitations. I asked her to take it on when necessary basis. 4. Obstructive sleep apnea: Awaiting sleep study.  I will see her back in about 2 months or sooner if she has a problem   Medication Adjustments/Labs and Tests Ordered: Current medicines are reviewed at length with the patient today.  Concerns regarding medicines are outlined above.  No orders of the defined types were placed in this encounter.  Medication changes: No orders of the defined types were placed in this encounter.   Signed, Georgeanna Lea, MD, [facility] 02/23/2017 4:18 PM    [facility]

## 2017-02-24 ENCOUNTER — Ambulatory Visit: Payer: 59 | Admitting: Cardiology

## 2017-03-18 ENCOUNTER — Ambulatory Visit (INDEPENDENT_AMBULATORY_CARE_PROVIDER_SITE_OTHER): Payer: 59 | Admitting: Medical

## 2017-03-18 ENCOUNTER — Encounter: Payer: Self-pay | Admitting: Medical

## 2017-03-18 ENCOUNTER — Telehealth: Payer: Self-pay | Admitting: Medical

## 2017-03-18 VITALS — BP 125/80 | HR 84 | Temp 98.1°F | Resp 16 | Ht 66.0 in | Wt 194.0 lb

## 2017-03-18 DIAGNOSIS — M797 Fibromyalgia: Secondary | ICD-10-CM | POA: Diagnosis not present

## 2017-03-18 DIAGNOSIS — R739 Hyperglycemia, unspecified: Secondary | ICD-10-CM

## 2017-03-18 DIAGNOSIS — E785 Hyperlipidemia, unspecified: Secondary | ICD-10-CM | POA: Diagnosis not present

## 2017-03-18 DIAGNOSIS — F419 Anxiety disorder, unspecified: Secondary | ICD-10-CM | POA: Diagnosis not present

## 2017-03-18 DIAGNOSIS — R350 Frequency of micturition: Secondary | ICD-10-CM

## 2017-03-18 DIAGNOSIS — J3089 Other allergic rhinitis: Secondary | ICD-10-CM | POA: Diagnosis not present

## 2017-03-18 DIAGNOSIS — R39859 Costovertebral (angle) tenderness, unspecified side: Secondary | ICD-10-CM

## 2017-03-18 DIAGNOSIS — M549 Dorsalgia, unspecified: Secondary | ICD-10-CM | POA: Diagnosis not present

## 2017-03-18 LAB — POC URINALSYSI DIPSTICK (AUTOMATED)
Bilirubin, UA: NEGATIVE
GLUCOSE UA: NEGATIVE
Ketones, UA: NEGATIVE
LEUKOCYTES UA: NEGATIVE
NITRITE UA: NEGATIVE
PH UA: 6 (ref 5.0–8.0)
Protein, UA: NEGATIVE
RBC UA: NEGATIVE
Spec Grav, UA: >=1.03 — AB (ref 1.010–1.025)
UROBILINOGEN UA: NEGATIVE U/dL — AB

## 2017-03-18 LAB — COMPREHENSIVE METABOLIC PANEL
ALBUMIN: 4.1 g/dL (ref 3.5–5.2)
ALT: 19 U/L (ref 0–35)
AST: 21 U/L (ref 0–37)
Alkaline Phosphatase: 56 U/L (ref 39–117)
BILIRUBIN TOTAL: 0.3 mg/dL (ref 0.2–1.2)
BUN: 11 mg/dL (ref 6–23)
CHLORIDE: 101 meq/L (ref 96–112)
CO2: 30 meq/L (ref 19–32)
CREATININE: 0.74 mg/dL (ref 0.40–1.20)
Calcium: 9.2 mg/dL (ref 8.4–10.5)
GFR: 128.35 mL/min (ref >60.00)
Glucose, Bld: 100 mg/dL — ABNORMAL HIGH (ref 70–99)
Potassium: 4 mEq/L (ref 3.5–5.1)
SODIUM: 137 meq/L (ref 135–145)
Total Protein: 7.5 g/dL (ref 6.0–8.3)

## 2017-03-18 LAB — LIPID PANEL
CHOL/HDL RATIO: 4
Cholesterol: 185 mg/dL (ref 0–200)
HDL: 52.4 mg/dL (ref >39.00)
LDL Cholesterol: 113 mg/dL — ABNORMAL HIGH (ref 0–99)
NonHDL: 132.19
Triglycerides: 98 mg/dL (ref 0.0–149.0)
VLDL: 19.6 mg/dL (ref 0.0–40.0)

## 2017-03-18 MED ORDER — MELOXICAM 7.5 MG PO TABS: 7.5000 mg | ORAL_TABLET | Freq: Every day | ORAL | 2 refills | Status: AC

## 2017-03-18 MED ORDER — LEVOCETIRIZINE DIHYDROCHLORIDE 5 MG PO TABS: 5.0000 mg | ORAL_TABLET | Freq: Every evening | ORAL | 3 refills | Status: AC

## 2017-03-18 MED ORDER — FLUTICASONE PROPIONATE 50 MCG/ACT NA SUSP: 2.0000 | Freq: Every day | NASAL | 3 refills | Status: AC

## 2017-03-18 NOTE — Progress Notes (Signed)
Subjective:    Patient ID: Kristine Soto, female    DOB: [birth year], 21 y.o.   MRN: [id number]  HPI  Pt in in for first time.  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 Pt does work out regularly at [employer]. She walks every day and goes to gym twice a week. No caffeine beverages. No smoke. No alcohol. Pt plays piano and enjoys music.   Pt has history of social anxiety and saw counselor just yesterday. Has seen counselor for 4 months. Pt states this is helping. Pt is on celexa and has been on this for 5 months. Given by former MD. Pt last saw her former pediatrician March 06, 2017.   Pt in past had some palpitation in the past. She had one pvc on holter and first degree av block. Dr. Bing Matter put her on b-blocker. She only takes if she has symptoms. She states will brief palpitation once every 3 days. Pt echo looked normal.   Pt saw cardiologist Feb 23, 2017. Told to follow up in 3 months.  Pt in past was given neurontin by MD who thought she might have fibromyalgia. Pt states appointment with rheumatologist is pending but on March 29, 2017. Pt states describes diffuse pain all over body.  lmp- February 27, 2017.  Hx of hyperlipidemia.  Chronic allergies. Nasal congestion and runny nose year round.   Review of Systems  Constitutional: Negative for chills, fatigue and fever.  Respiratory: Negative for cough, chest tightness, shortness of breath and wheezing.   Cardiovascular: Negative for chest pain and palpitations.  Gastrointestinal: Negative for abdominal pain.  Endocrine: Positive for polyuria. Negative for polydipsia and polyphagia.  Genitourinary: Positive for frequency. Negative for dysuria, flank pain, hematuria, pelvic pain, urgency, vaginal bleeding, vaginal discharge and vaginal pain.       After eating and drinking states frequent urination.  Musculoskeletal: Negative for back pain, joint swelling and neck pain.       Patient recently with frequency  of urination mentions some intermittent pain left side of back. Points to cva area.  Skin: Negative for rash.  Neurological: Negative for dizziness, speech difficulty, weakness and light-headedness.  Hematological: Negative for adenopathy. Does not bruise/bleed easily.  Psychiatric/Behavioral: Negative for agitation, behavioral problems, decreased concentration, dysphoric mood, hallucinations, sleep disturbance and suicidal ideas. The patient is nervous/anxious.        Celexa helps with both her anxiety and depression.    Past Medical History:  Diagnosis Date  . Fibromyalgia   . Multiple allergies   . Palpitations   . Seizures (HCC)   . Trigeminy      Social History   Social History  . Marital status: Single    Spouse name: N/A  . Number of children: N/A  . Years of education: N/A   Occupational History  . Not on file.   Social History Main Topics  . Smoking status: Never Smoker  . Smokeless tobacco: Never Used  . Alcohol use No  . Drug use: No  . Sexual activity: No   Other Topics Concern  . Not on file   Social History Narrative  . No narrative on file    No past surgical history on file.  Family History  Problem Relation Age of Onset  . Thyroid disease Mother   . Hyperlipidemia Father     Allergies  Allergen Reactions  . Peanut Oil Anaphylaxis  . Penicillins Anaphylaxis    Has patient had a PCN reaction causing  immediate rash, facial/tongue/throat swelling, SOB or lightheadedness with hypotension: unknown Has patient had a PCN reaction causing severe rash involving mucus membranes or skin necrosis:unknown Has patient had a PCN reaction that required hospitalization:unknown Has patient had a PCN reaction occurring within the last 10 years: unknown  Never taken If all of the above answers are "NO", then may proceed with Cephalosporin use.   . Egg Latanya Presser, Egg] Swelling  . Fish Allergy   . Fish-Derived Products Rash    Current Outpatient  Prescriptions on File Prior to Visit  Medication Sig Dispense Refill  . baclofen (LIORESAL) 10 MG tablet Take 1 tablet by mouth 3 (three) times daily.  0  . citalopram (CELEXA) 10 MG tablet Take 10 mg by mouth daily.  11  . gabapentin (NEURONTIN) 100 MG capsule Take 100 mg by mouth 2 (two) times daily.  1  . metoprolol tartrate (LOPRESSOR) 25 MG tablet Take 0.5 tablets (12.5 mg total) by mouth 2 (two) times daily as needed (Palpitations/chest pain). 30 tablet 3  . Vitamin D, Ergocalciferol, (DRISDOL) 50000 units CAPS capsule Take 1 capsule by mouth once a week.     No current facility-administered medications on file prior to visit.     BP 125/80   Pulse 84   Temp 98.1 F (36.7 C) (Oral)   Resp 16   Ht 5\' 6"  (1.676 m)   Wt 194 lb (88 kg)   SpO2 100%   BMI 31.31 kg/m       Objective:   Physical Exam   General Mental Status- Alert. General Appearance- Not in acute distress.   Skin General: Color- Normal Color. Moisture- Normal Moisture.  Neck Carotid Arteries- Normal color. Moisture- Normal Moisture. No carotid bruits. No JVD.  Chest and Lung Exam Auscultation: Breath Sounds:-Normal.  Cardiovascular Auscultation:Rythm- Regular. Murmurs & Other Heart Sounds:Auscultation of the heart reveals- No Murmurs.  Abdomen Inspection:-Inspeection Normal. Palpation/Percussion:Note:No mass. Palpation and Percussion of the abdomen reveal- Non Tender, Non Distended + BS, no rebound or guarding.    Neurologic Cranial Nerve exam:- CN III-XII intact(No nystagmus), symmetric smile. Strength:- 5/5 equal and symmetric strength both upper and lower extremities.    HEENT Head- Normal. Ear Auditory Canal - Left- Normal. Right - Normal.Tympanic Membrane- Left- Normal. Right- Normal. Eye Sclera/Conjunctiva- Left- Normal. Right- Normal. Nose & Sinuses Nasal Mucosa- Left-  Boggy and Congested. Right-  Boggy and  Congested.Bilateral maxillary and frontal sinus pressure. Mouth &  Throat Lips: Upper Lip- Normal: no dryness, cracking, pallor, cyanosis, or vesicular eruption. Lower Lip-Normal: no dryness, cracking, pallor, cyanosis or vesicular eruption. Buccal Mucosa- Bilateral- No Aphthous ulcers. Oropharynx- No Discharge or Erythema. Tonsils: Characteristics- Bilateral- No Erythema or Congestion. Size/Enlargement- Bilateral- No enlargement. Discharge- bilateral-None.  Back- no mid lumbar pain and on cva pain presently on exam(pain is transient and intermittent at rest)        Assessment & Plan:  For your recent left-sided back pain with frequent urination, I am getting urine culture to evaluate if you have infection.  If you have worse signs or symptoms please let us know pending urine culture.  For year history of anxiety continue with Celexa and counseling.  If anxiety were to worsen or your mood worsens please let us know.  For recent intermittent pain over year kidney region and history of high cholesterol, we are doing metabolic panel and lipid panel studies.  For history of allergic rhinitis, I am prescribing Xyzal antihistamine and Flonase nasal spray.  For possible fibromyalgia  continue Neurontin and I refilled your meloxicam.  When you attend the rheumatology appointment early November please have them send us the office note.  Follow-up in 4-6 weeks or as needed.  Dwayn Moravek, Ramon DredgeEdward, PA-C

## 2017-03-18 NOTE — Telephone Encounter (Signed)
Future a1c placed. 

## 2017-03-18 NOTE — Patient Instructions (Addendum)
For your recent left-sided back pain with frequent urination, I am getting urine culture to evaluate if you have infection.  If you have worse signs or symptoms please let us know pending urine culture.  With frequent urination will follow your metabolic panel and see if he had any sugar elevation.  If that were to be found then would get 4455-month blood sugar average.  For year history of anxiety continue with Celexa and counseling.  If anxiety were to worsen or your mood worsens please let us know.  For recent intermittent pain over year kidney region and history of high cholesterol, we are doing metabolic panel and lipid panel studies.  For history of allergic rhinitis, I am prescribing Xyzal antihistamine and Flonase nasal spray.  For possible fibromyalgia continue Neurontin and I refilled your meloxicam.  When you attend the rheumatology appointment early November please have them send us the office note.  Follow-up in 4-6 weeks or as needed.

## 2017-03-20 ENCOUNTER — Encounter (HOSPITAL_BASED_OUTPATIENT_CLINIC_OR_DEPARTMENT_OTHER): Payer: Self-pay | Admitting: Emergency Medicine

## 2017-03-20 ENCOUNTER — Emergency Department (HOSPITAL_BASED_OUTPATIENT_CLINIC_OR_DEPARTMENT_OTHER)
Admission: EM | Admit: 2017-03-20 | Discharge: 2017-03-20 | Disposition: A | Discharge status: Home / Self Care | Payer: 59 | Attending: Emergency Medicine | Admitting: Emergency Medicine

## 2017-03-20 ENCOUNTER — Telehealth: Payer: Self-pay | Admitting: Medical

## 2017-03-20 DIAGNOSIS — J45909 Unspecified asthma, uncomplicated: Secondary | ICD-10-CM | POA: Diagnosis not present

## 2017-03-20 DIAGNOSIS — Z79899 Other long term (current) drug therapy: Secondary | ICD-10-CM | POA: Insufficient documentation

## 2017-03-20 DIAGNOSIS — A084 Viral intestinal infection, unspecified: Secondary | ICD-10-CM | POA: Diagnosis not present

## 2017-03-20 DIAGNOSIS — R112 Nausea with vomiting, unspecified: Secondary | ICD-10-CM | POA: Diagnosis present

## 2017-03-20 DIAGNOSIS — Z91010 Allergy to peanuts: Secondary | ICD-10-CM | POA: Insufficient documentation

## 2017-03-20 HISTORY — DX: Generalized anxiety disorder: F41.1

## 2017-03-20 HISTORY — DX: Panic disorder (episodic paroxysmal anxiety): F41.0

## 2017-03-20 LAB — CBC WITH DIFFERENTIAL/PLATELET
BASOS ABS: 0 10*3/uL (ref 0.0–0.1)
BASOS PCT: 0 %
EOS ABS: 0.2 10*3/uL (ref 0.0–0.7)
Eosinophils Relative: 3 %
HEMATOCRIT: 38.9 % (ref 36.0–46.0)
HEMOGLOBIN: 13.1 g/dL (ref 12.0–15.0)
LYMPHS ABS: 2.3 10*3/uL (ref 0.7–4.0)
LYMPHS PCT: 34 %
MCH: 30.9 pg (ref 26.0–34.0)
MCHC: 33.7 g/dL (ref 30.0–36.0)
MCV: 91.7 fL (ref 78.0–100.0)
MONOS PCT: 13 %
Monocytes Absolute: 0.9 10*3/uL (ref 0.1–1.0)
NEUTROS ABS: 3.3 10*3/uL (ref 1.7–7.7)
Neutrophils Relative %: 50 %
Platelets: 322 10*3/uL (ref 150–400)
RBC: 4.24 MIL/uL (ref 3.87–5.11)
RDW: 12.6 % (ref 11.5–15.5)
WBC: 6.7 10*3/uL (ref 4.0–10.5)

## 2017-03-20 LAB — URINALYSIS, ROUTINE W REFLEX MICROSCOPIC
Bilirubin Urine: NEGATIVE
GLUCOSE, UA: NEGATIVE mg/dL
Hgb urine dipstick: NEGATIVE
KETONES UR: NEGATIVE mg/dL
LEUKOCYTES UA: NEGATIVE
NITRITE: NEGATIVE
PH: 8.5 — AB (ref 5.0–8.0)
Protein, ur: NEGATIVE mg/dL
SPECIFIC GRAVITY, URINE: 1.015 (ref 1.005–1.030)

## 2017-03-20 LAB — URINE CULTURE
MICRO NUMBER:: 81190580
SPECIMEN QUALITY:: ADEQUATE

## 2017-03-20 LAB — PREGNANCY, URINE: Preg Test, Ur: NEGATIVE

## 2017-03-20 LAB — COMPREHENSIVE METABOLIC PANEL
ALBUMIN: 3.9 g/dL (ref 3.5–5.0)
ALK PHOS: 62 U/L (ref 38–126)
ALT: 23 U/L (ref 14–54)
ANION GAP: 8 (ref 5–15)
AST: 27 U/L (ref 15–41)
BILIRUBIN TOTAL: 0.5 mg/dL (ref 0.3–1.2)
BUN: 8 mg/dL (ref 6–20)
CALCIUM: 9.2 mg/dL (ref 8.9–10.3)
CO2: 27 mmol/L (ref 22–32)
Chloride: 103 mmol/L (ref 101–111)
Creatinine, Ser: 0.62 mg/dL (ref 0.44–1.00)
GFR calc Af Amer: >60 mL/min (ref >60)
GFR calc non Af Amer: >60 mL/min (ref >60)
GLUCOSE: 105 mg/dL — AB (ref 65–99)
Potassium: 3.6 mmol/L (ref 3.5–5.1)
SODIUM: 138 mmol/L (ref 135–145)
TOTAL PROTEIN: 7.5 g/dL (ref 6.5–8.1)

## 2017-03-20 LAB — LIPASE, BLOOD: Lipase: 24 U/L (ref 11–51)

## 2017-03-20 MED ORDER — ONDANSETRON HCL 4 MG/2ML IJ SOLN
4.0000 mg | Freq: Once | INTRAMUSCULAR | Status: AC
  Administered 2017-03-20: 4 mg via INTRAVENOUS
  Filled 2017-03-20: qty 2

## 2017-03-20 MED ORDER — FENTANYL CITRATE (PF) 100 MCG/2ML IJ SOLN
50.0000 ug | Freq: Once | INTRAMUSCULAR | Status: AC
  Administered 2017-03-20: 50 ug via INTRAVENOUS
  Filled 2017-03-20: qty 2

## 2017-03-20 MED ORDER — PANTOPRAZOLE SODIUM 40 MG IV SOLR
40.0000 mg | Freq: Once | INTRAVENOUS | Status: AC
  Administered 2017-03-20: 40 mg via INTRAVENOUS
  Filled 2017-03-20: qty 40

## 2017-03-20 MED ORDER — FOSFOMYCIN TROMETHAMINE 3 G PO PACK: 3.0000 g | PACK | Freq: Once | ORAL | 0 refills | Status: AC

## 2017-03-20 MED ORDER — MORPHINE SULFATE (PF) 2 MG/ML IV SOLN
2.0000 mg | Freq: Once | INTRAVENOUS | Status: AC
  Administered 2017-03-20: 2 mg via INTRAVENOUS
  Filled 2017-03-20: qty 1

## 2017-03-20 MED ORDER — ONDANSETRON 8 MG PO TBDP: 8.0000 mg | ORAL_TABLET | Freq: Three times a day (TID) | ORAL | 0 refills | Status: DC | PRN

## 2017-03-20 MED ORDER — SODIUM CHLORIDE 0.9 % IV BOLUS (SEPSIS)
1000.0000 mL | Freq: Once | INTRAVENOUS | Status: AC
  Administered 2017-03-20: 1000 mL via INTRAVENOUS

## 2017-03-20 NOTE — ED Notes (Signed)
Pt still c/o abd pain. MD aware.

## 2017-03-20 NOTE — ED Triage Notes (Signed)
Pt c/o n/v/d with abd pain x 20 hrs

## 2017-03-20 NOTE — ED Provider Notes (Signed)
[facility] Provider Note: Lowella Dell, MD, FACEP  CSN: [id number] MRN: [id number] ARRIVAL: 03/20/17 at 0152 ROOM: [facility]   CHIEF COMPLAINT  Abdominal Pain   HISTORY OF PRESENT ILLNESS  03/20/17 2:05 AM Kristine Soto is a 21 y.o. female who complains of nausea, vomiting and diarrhea since yesterday morning about 6 AM.  She estimates she has vomited 10 times and describes it as watery.  Her diarrhea has been nonbloody.  She is also complaining of epigastric pain which has developed since the onset of symptoms.  The pain in her epigastrium is sharp and she rates it as a 10 out of 10.  It is worse with movement or vomiting.    Past Medical History:  Diagnosis Date  . Allergy    year round  . Anxiety   . Asthma    when younger but 5 years since any wheezing.  . Depression   . Fibromyalgia   . GAD (generalized anxiety disorder)   . Hyperlipidemia   . Multiple allergies   . Palpitations   . Panic disorder   . Seizures (HCC)   . Trigeminy     History reviewed. No pertinent surgical history.  Family History  Problem Relation Age of Onset  . Thyroid disease Mother   . Hyperlipidemia Father     Social History  Substance Use Topics  . Smoking status: Never Smoker  . Smokeless tobacco: Never Used  . Alcohol use No    Prior to Admission medications   Medication Sig Start Date End Date Taking? Authorizing Provider  baclofen (LIORESAL) 10 MG tablet Take 1 tablet by mouth 3 (three) times daily. 02/02/17   [provider]  citalopram (CELEXA) 10 MG tablet Take 10 mg by mouth daily. 11/24/16   [provider]  fluticasone (FLONASE) 50 MCG/ACT nasal spray Place 2 sprays into both nostrils daily. 03/18/17   Saguier, Ramon Dredge, PA-C  gabapentin (NEURONTIN) 100 MG capsule Take 100 mg by mouth 2 (two) times daily. 02/03/17   [provider]  levocetirizine (XYZAL) 5 MG tablet Take 1 tablet (5 mg total) by mouth every evening. 03/18/17    Saguier, Ramon Dredge, PA-C  meloxicam (MOBIC) 7.5 MG tablet Take 1 tablet (7.5 mg total) by mouth daily. 03/18/17   Saguier, Ramon Dredge, PA-C  metoprolol tartrate (LOPRESSOR) 25 MG tablet Take 0.5 tablets (12.5 mg total) by mouth 2 (two) times daily as needed (Palpitations/chest pain). 02/23/17 05/24/17  Georgeanna Lea, MD  Vitamin D, Ergocalciferol, (DRISDOL) 50000 units CAPS capsule Take 1 capsule by mouth once a week. 12/29/16   [provider]    Allergies Peanut oil; Penicillins; Egg white  [albumen, egg]; Fish allergy; and Fish-derived products   REVIEW OF SYSTEMS  Negative except as noted here or in the History of Present Illness.   PHYSICAL EXAMINATION  Initial Vital Signs Blood pressure (!) 124/101, pulse 94, temperature 98.7 F (37.1 C), temperature source Oral, resp. rate 20, height 5\' 6"  (1.676 m), weight 88 kg (194 lb), SpO2 100 %.  Examination General: Well-developed, well-nourished female in no acute distress; appearance consistent with age of record HENT: normocephalic; atraumatic Eyes: pupils equal, round and reactive to light; extraocular muscles intact Neck: supple Heart: regular rate and rhythm Lungs: clear to auscultation bilaterally Abdomen: soft; nondistended; epigastric tenderness; no masses or hepatosplenomegaly; bowel sounds present Extremities: No deformity; full range of motion; pulses normal Neurologic: Awake, alert and oriented; motor function intact in all extremities and symmetric; no facial droop  Skin: Warm and dry Psychiatric: Flat affect   RESULTS  Summary of this visit's results, reviewed by myself:   EKG Interpretation  Date/Time:    Ventricular Rate:    PR Interval:    QRS Duration:   QT Interval:    QTC Calculation:   R Axis:     Text Interpretation:        Laboratory Studies: Results for orders placed or performed during the hospital encounter of 03/20/17 (from the past 24 hour(s))  Pregnancy, urine     Status: None    Collection Time: 03/20/17  1:55 AM  Result Value Ref Range   Preg Test, Ur NEGATIVE NEGATIVE  Urinalysis, Routine w reflex microscopic     Status: Abnormal   Collection Time: 03/20/17  1:55 AM  Result Value Ref Range   Color, Urine YELLOW YELLOW   APPearance CLOUDY (A) CLEAR   Specific Gravity, Urine 1.015 1.005 - 1.030   pH 8.5 (H) 5.0 - 8.0   Glucose, UA NEGATIVE NEGATIVE mg/dL   Hgb urine dipstick NEGATIVE NEGATIVE   Bilirubin Urine NEGATIVE NEGATIVE   Ketones, ur NEGATIVE NEGATIVE mg/dL   Protein, ur NEGATIVE NEGATIVE mg/dL   Nitrite NEGATIVE NEGATIVE   Leukocytes, UA NEGATIVE NEGATIVE  Lipase, blood     Status: None   Collection Time: 03/20/17  2:20 AM  Result Value Ref Range   Lipase 24 11 - 51 U/L  Comprehensive metabolic panel     Status: Abnormal   Collection Time: 03/20/17  2:20 AM  Result Value Ref Range   Sodium 138 135 - 145 mmol/L   Potassium 3.6 3.5 - 5.1 mmol/L   Chloride 103 101 - 111 mmol/L   CO2 27 22 - 32 mmol/L   Glucose, Bld 105 (H) 65 - 99 mg/dL   BUN 8 6 - 20 mg/dL   Creatinine, Ser 8.29 0.44 - 1.00 mg/dL   Calcium 9.2 8.9 - 56.2 mg/dL   Total Protein 7.5 6.5 - 8.1 g/dL   Albumin 3.9 3.5 - 5.0 g/dL   AST 27 15 - 41 U/L   ALT 23 14 - 54 U/L   Alkaline Phosphatase 62 38 - 126 U/L   Total Bilirubin 0.5 0.3 - 1.2 mg/dL   GFR calc non Af Amer >60 >60 mL/min   GFR calc Af Amer >60 >60 mL/min   Anion gap 8 5 - 15  CBC with Differential/Platelet     Status: None   Collection Time: 03/20/17  2:20 AM  Result Value Ref Range   WBC 6.7 4.0 - 10.5 K/uL   RBC 4.24 3.87 - 5.11 MIL/uL   Hemoglobin 13.1 12.0 - 15.0 g/dL   HCT 13.0 86.5 - 78.4 %   MCV 91.7 78.0 - 100.0 fL   MCH 30.9 26.0 - 34.0 pg   MCHC 33.7 30.0 - 36.0 g/dL   RDW 69.6 29.5 - 28.4 %   Platelets 322 150 - 400 K/uL   Neutrophils Relative % 50 %   Lymphocytes Relative 34 %   Monocytes Relative 13 %   Eosinophils Relative 3 %   Basophils Relative 0 %   Neutro Abs 3.3 1.7 - 7.7 K/uL    Lymphs Abs 2.3 0.7 - 4.0 K/uL   Monocytes Absolute 0.9 0.1 - 1.0 K/uL   Eosinophils Absolute 0.2 0.0 - 0.7 K/uL   Basophils Absolute 0.0 0.0 - 0.1 K/uL   WBC Morphology ATYPICAL LYMPHOCYTES    Imaging Studies: No results found.  ED  COURSE  Nursing notes and initial vitals signs, including pulse oximetry, reviewed.  Vitals:   03/20/17 0159 03/20/17 0201 03/20/17 0355  BP:  (!) 124/101 114/76  Pulse:  94 80  Resp:  20 16  Temp:  98.7 F (37.1 C)   TempSrc:  Oral   SpO2:  100% 100%  Weight: 88 kg (194 lb)    Height: 5\' 6"  (1.676 m)     3:57 AM Patient feeling better after IV fluids and medications.  She rates her pain as a 3 out of 10 now.  She has had no emesis in the ED.  History and lab work consistent with viral gastroenteritis.  PROCEDURES    ED DIAGNOSES     ICD-10-CM   1. Viral gastroenteritis A08.4        Barre Aydelott, MD 03/20/17 [id number]

## 2017-03-20 NOTE — Telephone Encounter (Signed)
rx antibiotic sent to pharmacy.

## 2017-03-20 NOTE — ED Notes (Signed)
Attempted IV in right FA and left AC unsuccessfully.

## 2017-03-29 ENCOUNTER — Ambulatory Visit (HOSPITAL_BASED_OUTPATIENT_CLINIC_OR_DEPARTMENT_OTHER): Payer: 59 | Attending: Cardiology | Admitting: Cardiology

## 2017-03-29 VITALS — Ht 65.0 in | Wt 189.0 lb

## 2017-03-29 DIAGNOSIS — R0789 Other chest pain: Secondary | ICD-10-CM | POA: Diagnosis present

## 2017-03-29 DIAGNOSIS — R0683 Snoring: Secondary | ICD-10-CM

## 2017-03-29 DIAGNOSIS — R002 Palpitations: Secondary | ICD-10-CM | POA: Insufficient documentation

## 2017-03-29 DIAGNOSIS — F419 Anxiety disorder, unspecified: Secondary | ICD-10-CM

## 2017-03-29 DIAGNOSIS — G4733 Obstructive sleep apnea (adult) (pediatric): Secondary | ICD-10-CM

## 2017-03-30 NOTE — Procedures (Signed)
   Patient Name: Kristine Soto, Kristine Soto Study Date: 03/29/2017 Gender: Female D.O.B: Jun 01, 1995 Age (years): 21 Referring Provider: Georgeanna Leaobert J Krasowski Height (inches): 65 Interpreting Physician: Armanda Magicraci Turner MD, ABSM Weight (lbs): 189 RPSGT: Cherylann ParrDubili, Fred BMI: 31 MRN: [id number] Neck Size: 16.00  CLINICAL INFORMATION Sleep Study Type: NPSG  Indication for sleep study: Fatigue, OSA, Snoring, Witnessed Apneas  Epworth Sleepiness Score: 8  SLEEP STUDY TECHNIQUE As per the AASM Manual for the Scoring of Sleep and Associated Events v2.3 (April 2016) with a hypopnea requiring 4% desaturations.  The channels recorded and monitored were frontal, central and occipital EEG, electrooculogram (EOG), submentalis EMG (chin), nasal and oral airflow, thoracic and abdominal wall motion, anterior tibialis EMG, snore microphone, electrocardiogram, and pulse oximetry.  MEDICATIONS Medications self-administered by patient taken the night of the study : N/A  SLEEP ARCHITECTURE The study was initiated at 10:27:27 PM and ended at 4:52:11 AM.  Sleep onset time was 32.0 minutes and the sleep efficiency was 52.8%. The total sleep time was 203.0 minutes.  Stage REM latency was 124.0 minutes.  The patient spent 5.42% of the night in stage N1 sleep, 48.03% in stage N2 sleep, 30.79% in stage N3 and 15.76% in REM.  Alpha intrusion was absent.  Supine sleep was 39.25%.  RESPIRATORY PARAMETERS The overall apnea/hypopnea index (AHI) was 4.1 per hour. There were 7 total apneas, including 7 obstructive, 0 central and 0 mixed apneas. There were 7 hypopneas and 4 RERAs.  The AHI during Stage REM sleep was 18.8 per hour.  AHI while supine was 1.5 per hour.  The mean oxygen saturation was 96.61%. The minimum SpO2 during sleep was 91.00%.  soft snoring was noted during this study.  CARDIAC DATA The 2 lead EKG demonstrated sinus rhythm. The mean heart rate was 56.87 beats per minute. Other EKG findings  include: None.  LEG MOVEMENT DATA The total PLMS were 0 with a resulting PLMS index of 0.00. Associated arousal with leg movement index was 0.0 .  IMPRESSIONS - No significant obstructive sleep apnea occurred during this study (AHI = 4.1/h). - No significant central sleep apnea occurred during this study (CAI = 0.0/h). - The patient had minimal or no oxygen desaturation during the study (Min O2 = 91.00%) - The patient snored with soft snoring volume. - No cardiac abnormalities were noted during this study. - Clinically significant periodic limb movements did not occur during sleep. No significant associated arousals.  DIAGNOSIS - Normal study  RECOMMENDATIONS - Avoid alcohol, sedatives and other CNS depressants that may worsen sleep apnea and disrupt normal sleep architecture. - Sleep hygiene should be reviewed to assess factors that may improve sleep quality. - Weight management and regular exercise should be initiated or continued if appropriate.  Armanda Magicraci Turner Diplomate, American Board of Sleep Medicine  ELECTRONICALLY SIGNED ON:  03/30/2017, 8:56 AM [facility] PH: [phone]   FX: [phone] ACCREDITED BY THE AMERICAN ACADEMY OF SLEEP MEDICINE

## 2017-04-02 ENCOUNTER — Telehealth: Payer: Self-pay | Admitting: *Deleted

## 2017-04-02 NOTE — Telephone Encounter (Signed)
Informed patient of sleep study results and patient understanding was verbalized. Patient understands her sleep study showed no significant sleep apnea. Patient understands there are no further appointments scheduled for her at this time. Patient was grateful for the call and thanked me.

## 2017-04-02 NOTE — Telephone Encounter (Deleted)
-----   Message from Traci R Turner, MD sent at 03/30/2017  8:59 AM EST ----- Please let patient know that sleep study showed no significant sleep apnea.   

## 2017-04-02 NOTE — Telephone Encounter (Signed)
-----   Message from Quintella Reichertraci R Turner, MD sent at 03/30/2017  8:59 AM EST ----- Please let patient know that sleep study showed no significant sleep apnea.

## 2017-04-02 NOTE — Telephone Encounter (Signed)
LMTCB

## 2017-04-06 ENCOUNTER — Ambulatory Visit: Payer: Self-pay | Admitting: Medical

## 2017-04-15 ENCOUNTER — Ambulatory Visit: Payer: 59 | Admitting: Medical

## 2017-04-15 ENCOUNTER — Encounter: Payer: Self-pay | Admitting: Medical

## 2017-04-15 VITALS — BP 120/79 | HR 87 | Temp 98.6°F | Resp 16 | Ht 64.96 in | Wt 201.0 lb

## 2017-04-15 DIAGNOSIS — R739 Hyperglycemia, unspecified: Secondary | ICD-10-CM | POA: Diagnosis not present

## 2017-04-15 DIAGNOSIS — F329 Major depressive disorder, single episode, unspecified: Secondary | ICD-10-CM | POA: Diagnosis not present

## 2017-04-15 DIAGNOSIS — R5383 Other fatigue: Secondary | ICD-10-CM | POA: Diagnosis not present

## 2017-04-15 DIAGNOSIS — M791 Myalgia, unspecified site: Secondary | ICD-10-CM | POA: Diagnosis not present

## 2017-04-15 DIAGNOSIS — F419 Anxiety disorder, unspecified: Secondary | ICD-10-CM | POA: Diagnosis not present

## 2017-04-15 DIAGNOSIS — M2550 Pain in unspecified joint: Secondary | ICD-10-CM | POA: Diagnosis not present

## 2017-04-15 DIAGNOSIS — F32A Depression, unspecified: Secondary | ICD-10-CM

## 2017-04-15 LAB — TSH: TSH: 2.76 u[IU]/mL (ref 0.35–5.50)

## 2017-04-15 LAB — VITAMIN B12: Vitamin B-12: 390 pg/mL (ref 211–911)

## 2017-04-15 LAB — HEMOGLOBIN A1C: HEMOGLOBIN A1C: 5.3 % (ref 4.6–6.5)

## 2017-04-15 NOTE — Patient Instructions (Signed)
For your reported fatigue, I am ordering TSH, B12 and B1 level.  For history of mild high sugar, recommend low sugar diet and will get an A1c today to assess 1645-month blood sugar average.  Or your history of depression and anxiety continue Celexa.  You are currently using temazepam for insomnia and this probably helps with some nighttime anxiety as well.  You had 15 tablets left.  Your former pediatrician gave you these.  Let me know when you run out and I will need to decide if will refill this or give you something different.  If we do use controlled medication over the long-term for insomnia than we will need to give a urine drug screen and sign contract.  For diffuse myalgias and some joint pains, I want you to call the rheumatologist next week and see what the workup showed.  If you would have a rheumatologist send notes to our office.  If workup does indicate possible fibromyalgia then would consider medication such as Cymbalta.  If I were to write that might taper you off of the Celexa.  He had recent back pain you strongly describe correlation with drinking milk and eating ice cream.  History of lactose intolerance reported new or younger.  I would avoid any lactose-containing products.  By exam today I do not think you need any x-rays of lumbar spine or any urine studies.(No current symptoms.)  Follow-up date to be determined after lab review and rheumatology for review.  As needed as well.

## 2017-04-15 NOTE — Progress Notes (Signed)
Subjective:    Patient ID: Kristine Soto, female    DOB: [birth year], 21 y.o.   MRN: [id number]  HPI   Pt in for follow up.  She states doing fairly well.  Pt did see rheumatologist early November. She states test pending but they do think fibromyalgia is possible. Pt is still on gabapentin.   Pt states mild depression anxiety fairly well recently. Pt is going to counseling. Still on celexa. She states this is helping. Some insomnia and her former pediatrician gave temazepam for insomnia. She has about 15 tablets left. She has been using 15 mg dose. She uses temazepam about every other night.  Pt states she is still getting very rare occasional palpitations that are very transient. Will see Cardiologist early January.  Pt has some fatigue recently as well.(cbc and cmp 02-2017 were normal except sugar mild elevated))   Pt has some back pain. Lower areas. She associated this with milk. She has tested this various times. No gi symptoms. But hx of lactose intolerance. Pt other times ate ice cream and had pain. No uti signs or symptoms.  Review of Systems  Constitutional: Positive for fatigue. Negative for chills and fever.  Respiratory: Negative for chest tightness, shortness of breath and wheezing.   Cardiovascular: Negative for chest pain and palpitations.  Gastrointestinal: Negative for abdominal pain.  Musculoskeletal: Negative for back pain.       Diffuse body aches from neck down. Rheumatologist work up pending.  Skin: Negative for rash.  Neurological: Negative for facial asymmetry, speech difficulty, weakness, light-headedness and numbness.  Hematological: Negative for adenopathy. Does not bruise/bleed easily.  Psychiatric/Behavioral: Positive for dysphoric mood and sleep disturbance. Negative for behavioral problems, confusion, hallucinations and suicidal ideas. The patient is nervous/anxious.    Past Medical History:  Diagnosis Date  . Allergy    year round  . Anxiety     . Asthma    when younger but 5 years since any wheezing.  . Depression   . Fibromyalgia   . GAD (generalized anxiety disorder)   . Hyperlipidemia   . Multiple allergies   . Palpitations   . Panic disorder   . Seizures (HCC)   . Trigeminy      Social History   Socioeconomic History  . Marital status: Single    Spouse name: Not on file  . Number of children: Not on file  . Years of education: Not on file  . Highest education level: Not on file  Social Needs  . Financial resource strain: Not on file  . Food insecurity - worry: Not on file  . Food insecurity - inability: Not on file  . Transportation needs - medical: Not on file  . Transportation needs - non-medical: Not on file  Occupational History  . Not on file  Tobacco Use  . Smoking status: Never Smoker  . Smokeless tobacco: Never Used  Substance and Sexual Activity  . Alcohol use: No  . Drug use: No  . Sexual activity: No    Birth control/protection: None  Other Topics Concern  . Not on file  Social History Narrative  . Not on file    History reviewed. No pertinent surgical history.  Family History  Problem Relation Age of Onset  . Thyroid disease Mother   . Hyperlipidemia Father     Allergies  Allergen Reactions  . Peanut Oil Anaphylaxis  . Penicillins Anaphylaxis    Has patient had a PCN reaction causing immediate rash, facial/tongue/throat  swelling, SOB or lightheadedness with hypotension: unknown Has patient had a PCN reaction causing severe rash involving mucus membranes or skin necrosis:unknown Has patient had a PCN reaction that required hospitalization:unknown Has patient had a PCN reaction occurring within the last 10 years: unknown  Never taken If all of the above answers are "NO", then may proceed with Cephalosporin use.   . Egg Latanya PresserWhite  [Albumen, Egg] Swelling  . Fish Allergy   . Fish-Derived Products Rash    Current Outpatient Medications on File Prior to Visit  Medication Sig  Dispense Refill  . baclofen (LIORESAL) 10 MG tablet Take 1 tablet by mouth 3 (three) times daily.  0  . citalopram (CELEXA) 10 MG tablet Take 10 mg by mouth daily.  11  . fluticasone (FLONASE) 50 MCG/ACT nasal spray Place 2 sprays into both nostrils daily. 16 g 3  . gabapentin (NEURONTIN) 100 MG capsule Take 100 mg by mouth 2 (two) times daily.  1  . levocetirizine (XYZAL) 5 MG tablet Take 1 tablet (5 mg total) by mouth every evening. 90 tablet 3  . meloxicam (MOBIC) 7.5 MG tablet Take 1 tablet (7.5 mg total) by mouth daily. 30 tablet 2  . metoprolol tartrate (LOPRESSOR) 25 MG tablet Take 0.5 tablets (12.5 mg total) by mouth 2 (two) times daily as needed (Palpitations/chest pain). 30 tablet 3  . ondansetron (ZOFRAN ODT) 8 MG disintegrating tablet Take 1 tablet (8 mg total) by mouth every 8 (eight) hours as needed for nausea or vomiting. 10 tablet 0  . Vitamin D, Ergocalciferol, (DRISDOL) 50000 units CAPS capsule Take 1 capsule by mouth once a week.     No current facility-administered medications on file prior to visit.     BP 120/79 (BP Location: Right Arm, Patient Position: Sitting, Cuff Size: Large)   Pulse 87   Temp 98.6 F (37 C) (Oral)   Resp 16   Ht 5' 4.96" (1.65 m)   Wt 201 lb (91.2 kg)   SpO2 100%   BMI 33.49 kg/m       Objective:   Physical Exam  General Mental Status- Alert. General Appearance- Not in acute distress.   Skin General: Color- Normal Color. Moisture- Normal Moisture.  Neck Carotid Arteries- Normal color. Moisture- Normal Moisture. No carotid bruits. No JVD.  Chest and Lung Exam Auscultation: Breath Sounds:-Normal.  Cardiovascular Auscultation:Rythm- Regular. Murmurs & Other Heart Sounds:Auscultation of the heart reveals- No Murmurs.  Abdomen Inspection:-Inspeection Normal. Palpation/Percussion:Note:No mass. Palpation and Percussion of the abdomen reveal- Non Tender, Non Distended + BS, no rebound or guarding.  Neurologic Cranial Nerve  exam:- CN III-XII intact(No nystagmus), symmetric smile. Strength:- 5/5 equal and symmetric strength both upper and lower extremities.   back-no CVA tenderness presently.  No mid lumbar tenderness to palpation.        Assessment & Plan:  For your reported fatigue, I am ordering TSH, B12 and B1 level.  For history of mild high sugar, recommend low sugar diet and will get an A1c today to assess 5877-month blood sugar average.  Or your history of depression and anxiety continue Celexa.  You are currently using temazepam for insomnia and this probably helps with some nighttime anxiety as well.  You had 15 tablets left.  Your former pediatrician gave you these.  Let me know when you run out and I will need to decide if will refill this or give you something different.  If we do use controlled medication over the long-term for insomnia than we  will need to give a urine drug screen and sign contract.  For diffuse myalgias and some joint pains, I want you to call the rheumatologist next week and see what the workup showed.  If you would have a rheumatologist send notes to our office.  If workup does indicate possible fibromyalgia then would consider medication such as Cymbalta.  If I were to write that might taper you off of the Celexa.  He had recent back pain you strongly describe correlation with drinking milk and eating ice cream.  History of lactose intolerance reported new or younger.  I would avoid any lactose-containing products.  By exam today I do not think you need any x-rays of lumbar spine or any urine studies.(No current symptoms.)  Follow-up date to be determined after lab review and rheumatology for review.  As needed as well.  Wynelle Dreier, Ramon DredgeEdward, PA-C

## 2017-04-20 LAB — VITAMIN B1: VITAMIN B1 (THIAMINE): 13 nmol/L (ref 8–30)

## 2017-05-01 ENCOUNTER — Telehealth: Payer: Self-pay

## 2017-05-01 NOTE — Telephone Encounter (Signed)
Copied from CRM [id number]. Topic: General - Other >> May 01, 2017  8:24 AM Gerrianne ScalePayne, Angela L wrote: Reason for CRM: patient wanted to know if she need to do a urine test for a sleep aid that Dr saguier wanted her to be on but want prescribe unless urine is negative

## 2017-05-02 NOTE — Telephone Encounter (Signed)
Patient needs to come by and get a UDS.  She has a history of using benzodiazepines for insomnia.  I asked patient if she has ever used trazodone.  I could write this for her she could try it.  If it does not work then schedule her for a UDS.  Would  at that point consider writing further temazepam or Ambien.  Benefit of trazodone would be she would not have to do the UDS.  If she is willing to try you could just write trazodone 50 mg #30 with the sake as 1 tablet p.o. daily.  Just send that to me and I will sign the prescription if she is willing to try.

## 2017-05-07 MED ORDER — MIRTAZAPINE 15 MG PO TABS: 15.0000 mg | ORAL_TABLET | Freq: Every day | ORAL | 0 refills | Status: DC

## 2017-05-07 NOTE — Telephone Encounter (Signed)
Actually let patient know that I filled medication called Remeron instead of trazodone.  Both can help with insomnia but trazodone has interaction with Celexa.  So Remeron is in my opinion is safer option.

## 2017-05-07 NOTE — Addendum Note (Signed)
Addended by: Gwenevere AbbotSAGUIER, Coben Godshall M on: 05/07/2017 09:47 PM   Modules accepted: Orders

## 2017-05-07 NOTE — Telephone Encounter (Signed)
Pt is willing to try trazodone.

## 2017-05-11 NOTE — Telephone Encounter (Signed)
Pt.notified

## 2017-05-18 ENCOUNTER — Other Ambulatory Visit: Payer: Self-pay | Admitting: Medical

## 2017-06-07 ENCOUNTER — Other Ambulatory Visit: Payer: Self-pay | Admitting: Medical

## 2017-10-06 ENCOUNTER — Telehealth: Payer: Self-pay

## 2017-10-06 MED ORDER — GABAPENTIN 100 MG PO CAPS: 100.0000 mg | ORAL_CAPSULE | Freq: Two times a day (BID) | ORAL | 1 refills | Status: DC

## 2017-10-06 NOTE — Telephone Encounter (Signed)
Sent rx to pharmacy

## 2017-11-24 ENCOUNTER — Encounter: Payer: Self-pay | Admitting: Medical

## 2017-11-24 ENCOUNTER — Ambulatory Visit: Payer: BLUE CROSS/BLUE SHIELD | Admitting: Medical

## 2017-11-24 ENCOUNTER — Ambulatory Visit (HOSPITAL_BASED_OUTPATIENT_CLINIC_OR_DEPARTMENT_OTHER)
Admission: RE | Admit: 2017-11-24 | Discharge: 2017-11-24 | Disposition: A | Discharge status: Home / Self Care | Payer: BLUE CROSS/BLUE SHIELD | Source: Ambulatory Visit | Attending: Medical | Admitting: Medical

## 2017-11-24 VITALS — BP 121/89 | HR 94 | Temp 98.2°F | Resp 16 | Ht 64.0 in | Wt 201.6 lb

## 2017-11-24 DIAGNOSIS — E0789 Other specified disorders of thyroid: Secondary | ICD-10-CM | POA: Diagnosis not present

## 2017-11-24 DIAGNOSIS — R5383 Other fatigue: Secondary | ICD-10-CM | POA: Diagnosis not present

## 2017-11-24 DIAGNOSIS — E010 Iodine-deficiency related diffuse (endemic) goiter: Secondary | ICD-10-CM

## 2017-11-24 DIAGNOSIS — F329 Major depressive disorder, single episode, unspecified: Secondary | ICD-10-CM

## 2017-11-24 DIAGNOSIS — F32A Depression, unspecified: Secondary | ICD-10-CM

## 2017-11-24 DIAGNOSIS — R635 Abnormal weight gain: Secondary | ICD-10-CM | POA: Diagnosis not present

## 2017-11-24 DIAGNOSIS — E785 Hyperlipidemia, unspecified: Secondary | ICD-10-CM | POA: Diagnosis not present

## 2017-11-24 DIAGNOSIS — M797 Fibromyalgia: Secondary | ICD-10-CM

## 2017-11-24 DIAGNOSIS — R739 Hyperglycemia, unspecified: Secondary | ICD-10-CM | POA: Diagnosis not present

## 2017-11-24 LAB — IRON: Iron: 112 ug/dL (ref 42–145)

## 2017-11-24 LAB — COMPREHENSIVE METABOLIC PANEL
ALK PHOS: 74 U/L (ref 39–117)
ALT: 23 U/L (ref 0–35)
AST: 16 U/L (ref 0–37)
Albumin: 4.2 g/dL (ref 3.5–5.2)
BILIRUBIN TOTAL: 0.5 mg/dL (ref 0.2–1.2)
BUN: 6 mg/dL (ref 6–23)
CO2: 30 meq/L (ref 19–32)
CREATININE: 0.77 mg/dL (ref 0.40–1.20)
Calcium: 9.5 mg/dL (ref 8.4–10.5)
Chloride: 101 mEq/L (ref 96–112)
GFR: 121.77 mL/min (ref >60.00)
GLUCOSE: 92 mg/dL (ref 70–99)
Potassium: 4.3 mEq/L (ref 3.5–5.1)
Sodium: 138 mEq/L (ref 135–145)
TOTAL PROTEIN: 7.4 g/dL (ref 6.0–8.3)

## 2017-11-24 LAB — CBC WITH DIFFERENTIAL/PLATELET
BASOS PCT: 0.5 % (ref 0.0–3.0)
Basophils Absolute: 0 10*3/uL (ref 0.0–0.1)
Eosinophils Absolute: 0.1 10*3/uL (ref 0.0–0.7)
Eosinophils Relative: 2.2 % (ref 0.0–5.0)
HEMATOCRIT: 43.1 % (ref 36.0–46.0)
HEMOGLOBIN: 14.8 g/dL (ref 12.0–15.0)
LYMPHS PCT: 33.8 % (ref 12.0–46.0)
Lymphs Abs: 1.8 10*3/uL (ref 0.7–4.0)
MCHC: 34.4 g/dL (ref 30.0–36.0)
MCV: 90.3 fl (ref 78.0–100.0)
Monocytes Absolute: 0.4 10*3/uL (ref 0.1–1.0)
Monocytes Relative: 7.8 % (ref 3.0–12.0)
Neutro Abs: 2.9 10*3/uL (ref 1.4–7.7)
Neutrophils Relative %: 55.7 % (ref 43.0–77.0)
Platelets: 356 10*3/uL (ref 150.0–400.0)
RBC: 4.77 Mil/uL (ref 3.87–5.11)
RDW: 13.3 % (ref 11.5–14.6)
WBC: 5.2 10*3/uL (ref 4.5–10.5)

## 2017-11-24 LAB — VITAMIN D 25 HYDROXY (VIT D DEFICIENCY, FRACTURES): VITD: 20.22 ng/mL — ABNORMAL LOW (ref 30.00–100.00)

## 2017-11-24 LAB — VITAMIN B12: Vitamin B-12: 1012 pg/mL — ABNORMAL HIGH (ref 211–911)

## 2017-11-24 LAB — HEMOGLOBIN A1C: Hgb A1c MFr Bld: 5.6 % (ref 4.6–6.5)

## 2017-11-24 LAB — TSH: TSH: 0.48 u[IU]/mL (ref 0.35–5.50)

## 2017-11-24 LAB — T4, FREE: Free T4: 0.86 ng/dL (ref 0.60–1.60)

## 2017-11-24 NOTE — Patient Instructions (Signed)
For your moderate daily fatigue, I am placing labs to include a CBC, CMP, B12, B1, vitamin D, TSH and T4.  Also included iron level at your request.  For your fibromyalgia continue gabapentin and baclofen.  However I do recommend that you try to get by with just 1 baclofen at night.  This sedation side effects of baclofen might be a factor in a daytime fatigue.  For history of depression, I would recommend continuing citalopram as you report that that does help.  Might consider switching you to Effexor in the future but I do not want to do that presently.  Effexor sometimes can help with energy//fatigue.  For history of weight gain, we will see what your thyroid levels show.  Presently I do recommend that you try weight watchers.  You can download the app and see if this helps lose weight.  Would discourage extreme low calorie diet.  For history of mild elevated sugar and mild LDL elevation will include A1c and lipid panel and labs today.  For probable enlarged thyroid on exam, I did place the ultrasound of the neck today.  I tried to call downstairs but they were not available.  So please talk with radiology scheduling staff and get scheduled for that study.  Follow-up in 2 to 3 weeks or as needed.

## 2017-11-24 NOTE — Progress Notes (Signed)
Subjective:    Patient ID: Kristine Soto, female    DOB: [birth year], 22 y.o.   MRN: [id number]  HPI  Pt states she has moderate to persistent fatigue throughout the day. Moderate level fatigue. She exercises dong elliptical. Pt thinks maybe related to muscle relaxant. Pt mom has low thyroid and some aunts have low thyroid as well.  Pt was states also has been gaining weight over last year. Despite 1000 calorie diet at times.   Pt sugar level mild elevated at 105 8 months ago. Mild ldl elevation in past.   Pt has fibromyalgia. She is on gabapentin and baclofen.   LMP- one week ago at expected date.    Review of Systems  Constitutional: Positive for fatigue and unexpected weight change. Negative for activity change and chills.  Respiratory: Negative for cough, choking, chest tightness, shortness of breath and wheezing.   Cardiovascular: Negative for chest pain and palpitations.  Gastrointestinal: Negative for abdominal distention, abdominal pain, blood in stool, constipation and diarrhea.  Endocrine: Negative for polydipsia, polyphagia and polyuria.  Musculoskeletal: Negative for back pain and myalgias.       Bodyaches associated with fibromyalgia. Controlled presently.  Neurological: Negative for dizziness, syncope, speech difficulty, weakness and light-headedness.  Hematological: Negative for adenopathy. Does not bruise/bleed easily.  Psychiatric/Behavioral: Negative for behavioral problems and confusion.   Past Medical History:  Diagnosis Date  . Allergy    year round  . Anxiety   . Asthma    when younger but 5 years since any wheezing.  . Depression   . Fibromyalgia   . GAD (generalized anxiety disorder)   . Hyperlipidemia   . Multiple allergies   . Palpitations   . Panic disorder   . Seizures (HCC)   . Trigeminy      Social History   Socioeconomic History  . Marital status: Single    Spouse name: Not on file  . Number of children: Not on file  . Years of  education: Not on file  . Highest education level: Not on file  Occupational History  . Not on file  Social Needs  . Financial resource strain: Not on file  . Food insecurity:    Worry: Not on file    Inability: Not on file  . Transportation needs:    Medical: Not on file    Non-medical: Not on file  Tobacco Use  . Smoking status: Never Smoker  . Smokeless tobacco: Never Used  Substance and Sexual Activity  . Alcohol use: No  . Drug use: No  . Sexual activity: Never    Birth control/protection: None  Lifestyle  . Physical activity:    Days per week: Not on file    Minutes per session: Not on file  . Stress: Not on file  Relationships  . Social connections:    Talks on phone: Not on file    Gets together: Not on file    Attends religious service: Not on file    Active member of club or organization: Not on file    Attends meetings of clubs or organizations: Not on file    Relationship status: Not on file  . Intimate partner violence:    Fear of current or ex partner: Not on file    Emotionally abused: Not on file    Physically abused: Not on file    Forced sexual activity: Not on file  Other Topics Concern  . Not on file  Social History Narrative  .  Not on file    No past surgical history on file.  Family History  Problem Relation Age of Onset  . Thyroid disease Mother   . Hyperlipidemia Father     Allergies  Allergen Reactions  . Peanut Oil Anaphylaxis  . Penicillins Anaphylaxis    Has patient had a PCN reaction causing immediate rash, facial/tongue/throat swelling, SOB or lightheadedness with hypotension: unknown Has patient had a PCN reaction causing severe rash involving mucus membranes or skin necrosis:unknown Has patient had a PCN reaction that required hospitalization:unknown Has patient had a PCN reaction occurring within the last 10 years: unknown  Never taken If all of the above answers are "NO", then may proceed with Cephalosporin use.   .  Sulfamethoxazole-Trimethoprim Swelling  . Egg White  [Albumen, Egg] Swelling  . Fish Allergy Rash  . Fish-Derived Products Rash    Current Outpatient Medications on File Prior to Visit  Medication Sig Dispense Refill  . acetaminophen (TYLENOL) 500 MG tablet Take by mouth.    Marland Kitchen albuterol (PROVENTIL HFA;VENTOLIN HFA) 108 (90 Base) MCG/ACT inhaler Inhale into the lungs.    . baclofen (LIORESAL) 10 MG tablet Take 1 tablet by mouth 3 (three) times daily.  0  . citalopram (CELEXA) 10 MG tablet Take 10 mg by mouth daily.  11  . fluticasone (FLONASE) 50 MCG/ACT nasal spray Place 2 sprays into both nostrils daily. 16 g 3  . gabapentin (NEURONTIN) 100 MG capsule Take 1 capsule (100 mg total) by mouth 2 (two) times daily. 60 capsule 1  . levocetirizine (XYZAL) 5 MG tablet Take 1 tablet (5 mg total) by mouth every evening. 90 tablet 3  . meloxicam (MOBIC) 7.5 MG tablet Take 1 tablet (7.5 mg total) by mouth daily. 30 tablet 2  . mirtazapine (REMERON) 15 MG tablet TAKE 1 TABLET BY MOUTH EVERYDAY AT BEDTIME 30 tablet 0  . ondansetron (ZOFRAN ODT) 8 MG disintegrating tablet Take 1 tablet (8 mg total) by mouth every 8 (eight) hours as needed for nausea or vomiting. 10 tablet 0  . temazepam (RESTORIL) 15 MG capsule TAKE 1 CAPSULE BY MOUTH NIGHTLY AS NEEDED FOR SLEEP    . Vitamin D, Ergocalciferol, (DRISDOL) 50000 units CAPS capsule Take 1 capsule by mouth once a week.    . metoprolol tartrate (LOPRESSOR) 25 MG tablet Take 0.5 tablets (12.5 mg total) by mouth 2 (two) times daily as needed (Palpitations/chest pain). 30 tablet 3   No current facility-administered medications on file prior to visit.     BP 121/89   Pulse 94   Temp 98.2 F (36.8 C) (Oral)   Resp 16   Ht 5\' 4"  (1.626 m)   Wt 201 lb 9.6 oz (91.4 kg)   SpO2 98%   BMI 34.60 kg/m       Objective:   Physical Exam  General Mental Status- Alert. General Appearance- Not in acute distress.   Skin General: Color- Normal Color. Moisture-  Normal Moisture.  Neck Carotid Arteries- Normal color. Moisture- Normal Moisture. No carotid bruits. No JVD.  Chest and Lung Exam Auscultation: Breath Sounds:-Normal.  Cardiovascular Auscultation:Rythm- Regular. Murmurs & Other Heart Sounds:Auscultation of the heart reveals- No Murmurs.  Abdomen Inspection:-Inspeection Normal. Palpation/Percussion:Note:No mass. Palpation and Percussion of the abdomen reveal- Non Tender, Non Distended + BS, no rebound or guarding.    Neurologic Cranial Nerve exam:- CN III-XII intact(No nystagmus), symmetric smile. Strength:- 5/5 equal and symmetric strength both upper and lower extremities.      Assessment & Plan:  For your moderate daily fatigue, I am placing labs to include a CBC, CMP, B12, B1, vitamin D, TSH and T4.  Also included iron level at your request.  For your fibromyalgia continue gabapentin and baclofen.  However I do recommend that you try to get by with just 1 baclofen at night.  This sedation side effects of baclofen might be a factor in a daytime fatigue.  For history of depression, I would recommend continuing citalopram as you report that that does help.  Might consider switching you to Effexor in the future but I do not want to do that presently.  Effexor sometimes can help with energy//fatigue.  For history of weight gain, we will see what your thyroid levels show.  Presently I do recommend that you try weight watchers.  You can download the app and see if this helps lose weight.  Would discourage extreme low calorie diet.  For history of mild elevated sugar and mild LDL elevation will include A1c and lipid panel and labs today.  For probable enlarged thyroid on exam, I did place the ultrasound of the neck today.  I tried to call downstairs but they were not available.  So please talk with radiology scheduling staff and get scheduled for that study.  Follow-up in 2 to 3 weeks or as needed.  40 minutes spent with pt. 50% of  time spent counseling on plans for each condition and explaining work up for possible enlarge thyroid.  Esperanza RichtersEdward Sharmane Dame, PA-C

## 2017-11-25 ENCOUNTER — Telehealth: Payer: Self-pay | Admitting: Medical

## 2017-11-25 MED ORDER — VITAMIN D (ERGOCALCIFEROL) 1.25 MG (50000 UNIT) PO CAPS: 50000.0000 [IU] | ORAL_CAPSULE | ORAL | 0 refills | Status: AC

## 2017-11-25 NOTE — Telephone Encounter (Signed)
Rx vitamin D sent to pt pharmacy. 

## 2017-11-28 LAB — VITAMIN B1: Vitamin B1 (Thiamine): 14 nmol/L (ref 8–30)

## 2017-12-15 ENCOUNTER — Ambulatory Visit: Payer: BLUE CROSS/BLUE SHIELD | Admitting: Medical

## 2017-12-15 ENCOUNTER — Other Ambulatory Visit: Payer: Self-pay | Admitting: Medical

## 2017-12-15 ENCOUNTER — Encounter: Payer: Self-pay | Admitting: Medical

## 2017-12-15 VITALS — BP 118/83 | HR 80 | Temp 98.4°F | Ht 65.0 in | Wt 204.8 lb

## 2017-12-15 DIAGNOSIS — F329 Major depressive disorder, single episode, unspecified: Secondary | ICD-10-CM | POA: Diagnosis not present

## 2017-12-15 DIAGNOSIS — J4520 Mild intermittent asthma, uncomplicated: Secondary | ICD-10-CM

## 2017-12-15 DIAGNOSIS — F419 Anxiety disorder, unspecified: Secondary | ICD-10-CM | POA: Diagnosis not present

## 2017-12-15 DIAGNOSIS — F32A Depression, unspecified: Secondary | ICD-10-CM

## 2017-12-15 DIAGNOSIS — E669 Obesity, unspecified: Secondary | ICD-10-CM | POA: Diagnosis not present

## 2017-12-15 DIAGNOSIS — M797 Fibromyalgia: Secondary | ICD-10-CM | POA: Diagnosis not present

## 2017-12-15 MED ORDER — ALPRAZOLAM 0.5 MG PO TABS: 0.5000 mg | ORAL_TABLET | Freq: Every evening | ORAL | 0 refills | Status: DC | PRN

## 2017-12-15 MED ORDER — ALBUTEROL SULFATE HFA 108 (90 BASE) MCG/ACT IN AERS: 2.0000 | INHALATION_SPRAY | Freq: Four times a day (QID) | RESPIRATORY_TRACT | 2 refills | Status: AC | PRN

## 2017-12-15 MED ORDER — BACLOFEN 10 MG PO TABS: 10.0000 mg | ORAL_TABLET | Freq: Three times a day (TID) | ORAL | 1 refills | Status: DC

## 2017-12-15 MED ORDER — BECLOMETHASONE DIPROP HFA 40 MCG/ACT IN AERB: 2.0000 | INHALATION_SPRAY | Freq: Two times a day (BID) | RESPIRATORY_TRACT | 0 refills | Status: AC

## 2017-12-15 MED ORDER — VENLAFAXINE HCL ER 37.5 MG PO CP24: 37.5000 mg | ORAL_CAPSULE | Freq: Every day | ORAL | 0 refills | Status: DC

## 2017-12-15 NOTE — Progress Notes (Signed)
Subjective:    Patient ID: Kristine Soto, female    DOB: 02-05-1996, 22 y.o.   MRN: [id number]  HPI  Pt in for follow up.  She has been on vitamin D and states she has little more energy.  Pt has been trying to exercise. She is still trying to lose weight. Exercising 4 days a week. Pt has been trying to loose weight. She has been trying my fitness app. States eating [phone] calorie but still not loosing. Weight is up by 3 lbs. She did not try weight watchers.  Pt states her mood had been little worse recently despite use of celexa. Pt states some recent panic attacks. In past had stopped xanax about one year ago. She wanted to get off that thinking no longer needed. Now she wants to get back on xanax Pt willing to consider effexor in place of celexa.  Hx of asthma in past. Most recent wheezing was 3 weeks ago moderate wheezing. Pt states years since no use but then 5 weeks ago started to wheeze some. No meds used recently.  Pt used younger sisters albuterol and felt better.  LMP- 2 weeks ago.     Review of Systems  Constitutional: Positive for unexpected weight change. Negative for chills, fatigue and fever.  Respiratory: Negative for cough, chest tightness, shortness of breath and wheezing.        No wheezing today. But see hpi.  Cardiovascular: Negative for chest pain and palpitations.  Gastrointestinal: Negative for abdominal pain.  Musculoskeletal: Negative for back pain and gait problem.       Hx of fibromyalgia  Skin: Negative for rash.  Neurological: Negative for dizziness, speech difficulty and light-headedness.  Psychiatric/Behavioral: Positive for dysphoric mood. Negative for agitation, behavioral problems, decreased concentration, self-injury, sleep disturbance and suicidal ideas. The patient is nervous/anxious.    Past Medical History:  Diagnosis Date  . Allergy    year round  . Anxiety   . Asthma    when younger but 5 years since any wheezing.  . Depression    . Fibromyalgia   . GAD (generalized anxiety disorder)   . Hyperlipidemia   . Multiple allergies   . Palpitations   . Panic disorder   . Seizures (HCC)   . Trigeminy      Social History   Socioeconomic History  . Marital status: Single    Spouse name: Not on file  . Number of children: Not on file  . Years of education: Not on file  . Highest education level: Not on file  Occupational History  . Not on file  Social Needs  . Financial resource strain: Not on file  . Food insecurity:    Worry: Not on file    Inability: Not on file  . Transportation needs:    Medical: Not on file    Non-medical: Not on file  Tobacco Use  . Smoking status: Never Smoker  . Smokeless tobacco: Never Used  Substance and Sexual Activity  . Alcohol use: No  . Drug use: No  . Sexual activity: Never    Birth control/protection: None  Lifestyle  . Physical activity:    Days per week: Not on file    Minutes per session: Not on file  . Stress: Not on file  Relationships  . Social connections:    Talks on phone: Not on file    Gets together: Not on file    Attends religious service: Not on file  Active member of club or organization: Not on file    Attends meetings of clubs or organizations: Not on file    Relationship status: Not on file  . Intimate partner violence:    Fear of current or ex partner: Not on file    Emotionally abused: Not on file    Physically abused: Not on file    Forced sexual activity: Not on file  Other Topics Concern  . Not on file  Social History Narrative  . Not on file    No past surgical history on file.  Family History  Problem Relation Age of Onset  . Thyroid disease Mother   . Hyperlipidemia Father     Allergies  Allergen Reactions  . Peanut Oil Anaphylaxis  . Penicillins Anaphylaxis    Has patient had a PCN reaction causing immediate rash, facial/tongue/throat swelling, SOB or lightheadedness with hypotension: unknown Has patient had a PCN  reaction causing severe rash involving mucus membranes or skin necrosis:unknown Has patient had a PCN reaction that required hospitalization:unknown Has patient had a PCN reaction occurring within the last 10 years: unknown  Never taken If all of the above answers are "NO", then may proceed with Cephalosporin use.   . Sulfamethoxazole-Trimethoprim Swelling  . Egg White  [Albumen, Egg] Swelling  . Fish Allergy Rash  . Fish-Derived Products Rash    Current Outpatient Medications on File Prior to Visit  Medication Sig Dispense Refill  . acetaminophen (TYLENOL) 500 MG tablet Take 500 mg by mouth as needed.     Marland Kitchen. albuterol (PROVENTIL HFA;VENTOLIN HFA) 108 (90 Base) MCG/ACT inhaler Inhale into the lungs.    . fluticasone (FLONASE) 50 MCG/ACT nasal spray Place 2 sprays into both nostrils daily. (Patient taking differently: Place 2 sprays into both nostrils as needed. ) 16 g 3  . levocetirizine (XYZAL) 5 MG tablet Take 1 tablet (5 mg total) by mouth every evening. 90 tablet 3  . temazepam (RESTORIL) 15 MG capsule TAKE 1 CAPSULE BY MOUTH NIGHTLY AS NEEDED FOR SLEEP    . Vitamin D, Ergocalciferol, (DRISDOL) 50000 units CAPS capsule Take 1 capsule (50,000 Units total) by mouth every 7 (seven) days. 8 capsule 0  . meloxicam (MOBIC) 7.5 MG tablet Take 1 tablet (7.5 mg total) by mouth daily. (Patient not taking: Reported on 12/15/2017) 30 tablet 2  . metoprolol tartrate (LOPRESSOR) 25 MG tablet Take 0.5 tablets (12.5 mg total) by mouth 2 (two) times daily as needed (Palpitations/chest pain). 30 tablet 3  . Vitamin D, Ergocalciferol, (DRISDOL) 50000 units CAPS capsule Take 1 capsule by mouth once a week.     No current facility-administered medications on file prior to visit.     BP 118/83 (BP Location: Right Arm, Patient Position: Sitting, Cuff Size: Large)   Pulse 80   Temp 98.4 F (36.9 C) (Oral)   Ht 5\' 5"  (1.651 m)   Wt 204 lb 12.8 oz (92.9 kg)   SpO2 98%   BMI 34.08 kg/m         Objective:   Physical Exam  General Mental Status- Alert. General Appearance- Not in acute distress.   Skin General: Color- Normal Color. Moisture- Normal Moisture.  Neck Carotid Arteries- Normal color. Moisture- Normal Moisture. No carotid bruits. No JVD.  Chest and Lung Exam Auscultation: Breath Sounds:-Normal.  Cardiovascular Auscultation:Rythm- Regular. Murmurs & Other Heart Sounds:Auscultation of the heart reveals- No Murmurs.  Abdomen Inspection:-Inspeection Normal. Palpation/Percussion:Note:No mass. Palpation and Percussion of the abdomen reveal- Non Tender, Non Distended +  BS, no rebound or guarding.    Neurologic Cranial Nerve exam:- CN III-XII intact(No nystagmus), symmetric smile. Strength:- 5/5 equal and symmetric strength both upper and lower extremities.      Assessment & Plan:  For your history of obesity and desire to lose weight, I am going to refer you to a weight loss specialist in the [place]. I do think this would be a good idea in light of your severe reduced calorie intake yet you continue notto lose weight and sometimes appears to gain weight.  For depression and anxiety, I am going to ask that you stop Celexa and going to prescribe Effexor in place of Celexa.  Hopefully this will improve your mood and also improve your energy.  For severe anxiety or panic attacks, I am making limited number of Xanax available.  Try to use Xanax as sparingly as possible.  Please give me an update in 2 weeks on how you are doing with both Effexor and update on how many Xanax you have left.  If you have any severe worsening mood then recommend evaluation at [facility], ED.  For history of fibromyalgia, continue on gabapentin and baclofen.  For history of mild intermittent asthma with recent flares that coincided with severe hot dry weather, I am making Qvar inhaler available and albuterol inhaler available.  Use as directed/explained today.  Follow-up in 2 weeks or as  needed.  Esperanza Richters, PA-C

## 2017-12-15 NOTE — Patient Instructions (Addendum)
For your history of obesity and desire to lose weight, I am going to refer you to a weight loss specialist in the [place]. I do think this would be a good idea in light of your severe reduced calorie intake yet you continue notto lose weight and sometimes appears to gain weight.  For depression and anxiety, I am going to ask that you stop Celexa and going to prescribe Effexor in place of Celexa.  Hopefully this will improve your mood and also improve your energy.  For severe anxiety or panic attacks, I am making limited number of Xanax available.  Try to use Xanax as sparingly as possible.  Please give me an update in 2 weeks on how you are doing with both Effexor and update on how many Xanax you have left.  If you have any severe worsening mood then [facility].  For history of fibromyalgia, continue on gabapentin and baclofen.  For history of mild intermittent asthma with recent flares that coincided with severe hot dry weather, I am making Qvar inhaler available and albuterol inhaler available.  Use as directed/explained today.  Follow-up in 2 weeks or as needed.

## 2017-12-16 ENCOUNTER — Telehealth: Payer: Self-pay | Admitting: Medical

## 2017-12-16 DIAGNOSIS — E669 Obesity, unspecified: Secondary | ICD-10-CM

## 2017-12-16 NOTE — Telephone Encounter (Signed)
Referral to weight management placed.

## 2017-12-16 NOTE — Addendum Note (Signed)
Addended by: Gwenevere AbbotSAGUIER, Amoni Scallan M on: 12/16/2017 08:59 PM   Modules accepted: Orders

## 2017-12-21 ENCOUNTER — Telehealth: Payer: Self-pay

## 2017-12-21 NOTE — Telephone Encounter (Signed)
I saw this after hours(reviewed after hours). You could advise her UC or ED evaluation if pain persisting or if palpitations be seen as well. I am in office half day tomorrow. If she feels she can wait until Wednesday then recommend be seen at either of UC or ED.

## 2017-12-21 NOTE — Telephone Encounter (Signed)
[facility]'s self-made appointment for 7/31 with PCP for heart palpitations. Author phoned pt. Pt. stated she has sharp reoccurring chest pains at center of her chest, which she has had for years, but have worsened over the past few months. Pt. denies SOB, N/V, or any change in diet. Pt denies CP being related to panic attacks. Routed to [place] to advise.

## 2017-12-22 NOTE — Telephone Encounter (Signed)
Author phoned pt. to follow up acute-on-chronic complaint. No answer; author left detailed VM relaying Edward's message. Call back number given #[phone].

## 2017-12-23 ENCOUNTER — Ambulatory Visit (HOSPITAL_BASED_OUTPATIENT_CLINIC_OR_DEPARTMENT_OTHER)
Admission: RE | Admit: 2017-12-23 | Discharge: 2017-12-23 | Disposition: A | Discharge status: Home / Self Care | Payer: BLUE CROSS/BLUE SHIELD | Source: Ambulatory Visit | Attending: Medical | Admitting: Medical

## 2017-12-23 ENCOUNTER — Encounter

## 2017-12-23 ENCOUNTER — Ambulatory Visit: Payer: BLUE CROSS/BLUE SHIELD | Admitting: Medical

## 2017-12-23 ENCOUNTER — Encounter: Payer: Self-pay | Admitting: Medical

## 2017-12-23 VITALS — BP 121/90 | HR 81 | Temp 98.4°F | Resp 16 | Ht 65.0 in | Wt 203.2 lb

## 2017-12-23 DIAGNOSIS — R0789 Other chest pain: Secondary | ICD-10-CM | POA: Diagnosis not present

## 2017-12-23 DIAGNOSIS — Z8709 Personal history of other diseases of the respiratory system: Secondary | ICD-10-CM | POA: Diagnosis not present

## 2017-12-23 DIAGNOSIS — R079 Chest pain, unspecified: Secondary | ICD-10-CM

## 2017-12-23 LAB — TROPONIN I: TNIDX: 0 ug/l (ref 0.00–0.06)

## 2017-12-23 NOTE — Patient Instructions (Addendum)
You had no chest pain or palpitations for more than 24 hours.  History of a negative cardiac work-up except he did have history of occasional PVCs.  So would recommend that you discontinue caffeine use.  Because of your chest wall pain is undetermined as of yet.  Negative exam for costochondritis.  For caution sake we will go ahead and do troponin stat.  Also you have a history of asthma and I do not see any chest x-ray so we will go ahead and get a chest x-ray today.  ekg appeared nsr today. Compared to prior appears same.  In the event that you have severe chest pain or palpitations then recommend ED evaluation or call 911.  You do have appointment with cardiologist this week.  Please keep that appointment.  In the event that you have any noted chest wall pain on deep inspiration or on palpation then can try ibuprofen over-the-counter.  Follow-up in 10 to 14 days with me or as needed.  We will review cardiologist work-up when he send the report.

## 2017-12-23 NOTE — Progress Notes (Signed)
Subjective:    Patient ID: Kristine Soto, female    DOB: [birth year], 22 y.o.   MRN: [id number]  HPI  Pt in states over past 3 days some chest pain. None in past 24 hours.  First time recently  had pain was on Sunday. That lasted for 5 minutes and was intense. Pain was in center of chest. No associated cardiac type signs or symptoms. EMS came out and test was normal(no ED eval).  Monday and Tuesday transient had transient pain that lasted 15-20 seconds each time. Pain much less intense. She states no palpitation during chest pain event but some separately. Last palpitation event lasted about one minute on Saturday. About one week ago 2 other brief palpitation sensation. None associated with pain.  Twice a week drinks coffee in am.  Pt seeing cardiologist on Friday. Pt will see Dr. Dewayne ShorterKrazowski on Friday. Pt holter in past showed rare pvc. Echo also done.  No pain on deep inspiration or palpitation.  LMP- approx December 02, 2017.  Pt denies any recent anxiety preceding chest pain or palpitations.   Review of Systems  Constitutional: Negative for chills, diaphoresis and fever.  Respiratory: Negative for apnea, cough, chest tightness, shortness of breath and wheezing.   Cardiovascular: Negative for chest pain and palpitations.       Atypical chest pain.  Gastrointestinal: Negative for abdominal distention, abdominal pain, anal bleeding, constipation and diarrhea.  Musculoskeletal:       See hpi.  No reported ppoliteal pain or leg swelling.  Skin: Negative for rash.  Neurological: Negative for dizziness, speech difficulty, weakness, numbness and headaches.  Hematological: Negative for adenopathy. Does not bruise/bleed easily.  Psychiatric/Behavioral: Negative for behavioral problems.       Objective:   Physical Exam  General Mental Status- Alert. General Appearance- Not in acute distress.   Skin General: Color- Normal Color. Moisture- Normal Moisture.  Neck Carotid Arteries-  Normal color. Moisture- Normal Moisture. No carotid bruits. No JVD.  Chest and Lung Exam Auscultation: Breath Sounds:-Normal.  Cardiovascular Auscultation:Rythm- Regular. Murmurs & Other Heart Sounds:Auscultation of the heart reveals- No Murmurs.  Abdomen Inspection:-Inspeection Normal. Palpation/Percussion:Note:No mass. Palpation and Percussion of the abdomen reveal- Non Tender, Non Distended + BS, no rebound or guarding.   Neurologic Cranial Nerve exam:- CN III-XII intact(No nystagmus), symmetric smile. Strength:- 5/5 equal and symmetric strength both upper and lower extremities.   Anterior chest- no pain on palpitation. No pain on deep inspiration.      Assessment & Plan:  You had no chest pain or palpitations for more than 24 hours.  History of a negative cardiac work-up except he did have history of occasional PVCs.  So would recommend that you discontinue caffeine use.  Because of your chest wall pain is undetermined as of yet.  Negative exam for costochondritis.  For caution sake we will go ahead and do troponin stat.  Also you have a history of asthma and I do not see any chest x-ray so we will go ahead and get a chest x-ray today.  In the event that you have severe chest pain or palpitations then recommend ED evaluation or call 911.  You do have appointment with cardiologist this week.  Please keep that appointment.  In the event that you have any noted chest wall pain on deep inspiration or on palpation then can try ibuprofen over-the-counter.  Follow-up in 10 to 14 days with me or as needed.  We will review cardiologist work-up when he send  the report.  Esperanza Richters, PA-C

## 2017-12-25 ENCOUNTER — Encounter: Payer: Self-pay | Admitting: Cardiology

## 2017-12-25 ENCOUNTER — Encounter

## 2017-12-25 ENCOUNTER — Ambulatory Visit: Payer: BLUE CROSS/BLUE SHIELD | Admitting: Cardiology

## 2017-12-25 VITALS — BP 120/82 | HR 79 | Ht 65.0 in | Wt 203.8 lb

## 2017-12-25 DIAGNOSIS — R0789 Other chest pain: Secondary | ICD-10-CM | POA: Diagnosis not present

## 2017-12-25 DIAGNOSIS — R002 Palpitations: Secondary | ICD-10-CM

## 2017-12-25 DIAGNOSIS — F419 Anxiety disorder, unspecified: Secondary | ICD-10-CM | POA: Diagnosis not present

## 2017-12-25 NOTE — Progress Notes (Signed)
Cardiology Office Note:    Date:  12/25/2017   ID:  Kristine Soto, DOB 18-Jan-1996, MRN [id number]  PCP:  Esperanza Richters, PA-C  Cardiologist:  Gypsy Balsam, MD    Referring MD: Marisue Brooklyn   Chief Complaint  Patient presents with  . Chest Pain    chest pain x 3 days   I have palpitations chest pain  History of Present Illness:    Kristine Soto is a 22 y.o. female with the palpitations she did wear event recorder before which shows some extrasystole.  I gave her some metoprolol however she takes metoprolol very well situations typically about 2-3 times a week she complained of having some more palpitation also stabbing-like pain in the chest not related to exertion lasting only for split seconds.  She try to go to gym and exercise on the regular basis but lately she is been worried about her symptoms that is why she does not go there.  Past Medical History:  Diagnosis Date  . Allergy    year round  . Anxiety   . Asthma    when younger but 5 years since any wheezing.  . Depression   . Fibromyalgia   . GAD (generalized anxiety disorder)   . Hyperlipidemia   . Multiple allergies   . Palpitations   . Panic disorder   . Seizures (HCC)   . Trigeminy     No past surgical history on file.  Current Medications: Current Meds  Medication Sig  . albuterol (PROVENTIL HFA;VENTOLIN HFA) 108 (90 Base) MCG/ACT inhaler Inhale into the lungs.  Marland Kitchen albuterol (PROVENTIL HFA;VENTOLIN HFA) 108 (90 Base) MCG/ACT inhaler Inhale 2 puffs into the lungs every 6 (six) hours as needed for wheezing or shortness of breath.  . ALPRAZolam (XANAX) 0.5 MG tablet Take 1 tablet (0.5 mg total) by mouth at bedtime as needed for anxiety.  . beclomethasone (QVAR REDIHALER) 40 MCG/ACT inhaler Inhale 2 puffs into the lungs 2 (two) times daily.  . fluticasone (FLONASE) 50 MCG/ACT nasal spray Place 2 sprays into both nostrils daily. (Patient taking differently: Place 2 sprays into both nostrils as  needed. )  . gabapentin (NEURONTIN) 100 MG capsule TAKE 1 CAPSULE BY MOUTH TWICE A DAY  . venlafaxine XR (EFFEXOR-XR) 37.5 MG 24 hr capsule Take 1 capsule (37.5 mg total) by mouth daily with breakfast.  . Vitamin D, Ergocalciferol, (DRISDOL) 50000 units CAPS capsule Take 1 capsule (50,000 Units total) by mouth every 7 (seven) days.     Allergies:   Peanut oil; Penicillins; Sulfamethoxazole-trimethoprim; Egg white  [albumen, egg]; Fish allergy; and Fish-derived products   Social History   Socioeconomic History  . Marital status: Single    Spouse name: Not on file  . Number of children: Not on file  . Years of education: Not on file  . Highest education level: Not on file  Occupational History  . Not on file  Social Needs  . Financial resource strain: Not on file  . Food insecurity:    Worry: Not on file    Inability: Not on file  . Transportation needs:    Medical: Not on file    Non-medical: Not on file  Tobacco Use  . Smoking status: Never Smoker  . Smokeless tobacco: Never Used  Substance and Sexual Activity  . Alcohol use: No  . Drug use: No  . Sexual activity: Never    Birth control/protection: None  Lifestyle  . Physical activity:    Days  per week: Not on file    Minutes per session: Not on file  . Stress: Not on file  Relationships  . Social connections:    Talks on phone: Not on file    Gets together: Not on file    Attends religious service: Not on file    Active member of club or organization: Not on file    Attends meetings of clubs or organizations: Not on file    Relationship status: Not on file  Other Topics Concern  . Not on file  Social History Narrative  . Not on file     Family History: The patient's family history includes Hyperlipidemia in her father; Thyroid disease in her mother. ROS:   Please see the history of present illness.    All 14 point review of systems negative except as described per history of present illness  EKGs/Labs/Other  Studies Reviewed:      Recent Labs: 11/24/2017: ALT 23; BUN 6; Creatinine, Ser 0.77; Hemoglobin 14.8; Platelets 356.0; Potassium 4.3; Sodium 138; TSH 0.48  Recent Lipid Panel    Component Value Date/Time   CHOL 185 03/18/2017 1050   CHOL 193 12/19/2016 1154   TRIG 98.0 03/18/2017 1050   HDL 52.40 03/18/2017 1050   HDL 47 12/19/2016 1154   CHOLHDL 4 03/18/2017 1050   VLDL 19.6 03/18/2017 1050   LDLCALC 113 (H) 03/18/2017 1050   LDLCALC 138 (H) 12/19/2016 1154    Physical Exam:    VS:  BP 120/82 (BP Location: Right Arm)   Pulse 79   Ht 5\' 5"  (1.651 m)   Wt 203 lb 12.8 oz (92.4 kg)   SpO2 99%   BMI 33.91 kg/m     Wt Readings from Last 3 Encounters:  12/25/17 203 lb 12.8 oz (92.4 kg)  12/23/17 203 lb 3.2 oz (92.2 kg)  12/15/17 204 lb 12.8 oz (92.9 kg)     GEN:  Well nourished, well developed in no acute distress HEENT: Normal NECK: No JVD; No carotid bruits LYMPHATICS: No lymphadenopathy CARDIAC: RRR, no murmurs, no rubs, no gallops RESPIRATORY:  Clear to auscultation without rales, wheezing or rhonchi  ABDOMEN: Soft, non-tender, non-distended MUSCULOSKELETAL:  No edema; No deformity  SKIN: Warm and dry LOWER EXTREMITIES: no swelling NEUROLOGIC:  Alert and oriented x 3 PSYCHIATRIC:  Normal affect   ASSESSMENT:    1. Atypical chest pain   2. Palpitations   3. Anxiety    PLAN:    In order of problems listed above:  1. Atypical chest pain.  I doubt very much the related to the heart and she does not have any risk factors for it and pain is very atypical however I think usage of beta-blocker on the regular basis can be helpful. 2. Palpitations I recommended to her to start taking metoprolol on the regular basis not on as needed I advised him to continue follow-up weekly to see if symptoms will improve if not she will contact me otherwise to see her back in a month  3. Anxiety: I still think it can be helpful with beta-blocker which we will try.   Medication  Adjustments/Labs and Tests Ordered: Current medicines are reviewed at length with the patient today.  Concerns regarding medicines are outlined above.  No orders of the defined types were placed in this encounter.  Medication changes: No orders of the defined types were placed in this encounter.   Signed, Georgeanna Leaobert J. Krasowski, MD, [facility] 12/25/2017 10:20 AM    [facility]

## 2017-12-25 NOTE — Patient Instructions (Signed)
Medication Instructions:  Your physician recommends that you continue on your current medications as directed. Please refer to the Current Medication list given to you today.   Labwork: None  Testing/Procedures: You had an EKG today.   Follow-Up: Your physician recommends that you schedule a follow-up appointment in: 1 month.   If you need a refill on your cardiac medications before your next appointment, please call your pharmacy.   Thank you for choosing [facility]! Roquel Burgin, RN [phone]    

## 2017-12-28 ENCOUNTER — Telehealth: Payer: Self-pay

## 2017-12-28 NOTE — Telephone Encounter (Signed)
Copied from CRM [id number]. Topic: General - Other >> Dec 28, 2017  4:05 PM Tamela OddiHarris, Brenda J wrote: Reason for CRM: Patient called to inform doctor that the medication he prescribed, venlafaxine XR (EFFEXOR-XR) 37.5 MG 24 hr capsule, is working well.  Patient would like for doctor to send in prescription.  Please advise.  CB# [phone].

## 2017-12-31 ENCOUNTER — Other Ambulatory Visit: Payer: Self-pay | Admitting: Medical

## 2018-01-01 NOTE — Telephone Encounter (Signed)
Okay to fill Effexor?

## 2018-01-03 NOTE — Telephone Encounter (Signed)
I refilled pt effexor. Would you call her and see how she is doing? Did it help? Let me know what she says.

## 2018-01-04 NOTE — Telephone Encounter (Signed)
Prescription  effexor sent in to pharmacy.

## 2018-01-20 ENCOUNTER — Other Ambulatory Visit: Payer: Self-pay | Admitting: Medical

## 2018-01-22 NOTE — Telephone Encounter (Signed)
Requesting:Alprazolam Contract:none, needs csc EAV:WUJWDS:none, needs uds Last Visit:12/23/17 Next Visit:none with pcp Last Refill:12/15/17  Please Advise

## 2018-01-23 MED ORDER — VENLAFAXINE HCL ER 37.5 MG PO CP24: 37.5000 mg | ORAL_CAPSULE | Freq: Every day | ORAL | 0 refills | Status: DC

## 2018-01-23 MED ORDER — ALPRAZOLAM 0.5 MG PO TABS: 0.5000 mg | ORAL_TABLET | Freq: Every evening | ORAL | 0 refills | Status: DC | PRN

## 2018-01-26 ENCOUNTER — Telehealth: Payer: Self-pay | Admitting: Medical

## 2018-01-26 MED ORDER — ALPRAZOLAM 0.5 MG PO TABS: 0.5000 mg | ORAL_TABLET | Freq: Every evening | ORAL | 0 refills | Status: DC | PRN

## 2018-01-26 NOTE — Telephone Encounter (Signed)
Could you call and schedule appointment.  

## 2018-01-26 NOTE — Telephone Encounter (Signed)
I did send pt limited rx of xanax to her pharmacy. Shredded print rx. Will you call her and advise her to follow up. Want to discuss possible increasing effexor dose. Also if she feels will need xanax going forward then will need to sign contract, uds etc.

## 2018-01-26 NOTE — Telephone Encounter (Signed)
Did you accidentally send this to me?

## 2018-01-27 NOTE — Telephone Encounter (Signed)
LVM for pt to return call so that pt can schedule an appt with provider since provider requested.

## 2018-01-29 ENCOUNTER — Other Ambulatory Visit: Payer: Self-pay | Admitting: Medical

## 2018-02-02 NOTE — Telephone Encounter (Signed)
I denied her xanax refill. She needs appointment. Might increase her effexor does and she may need to be on contract if needs further xanax refills. If you could get her scheduled.

## 2018-02-02 NOTE — Telephone Encounter (Signed)
Refill Request: Alprazolam   Last RX:01/26/18  Last OV:12/23/17 Next BW:[facility] scheduled  TDS:KAJG on file  OTL:XBWI on file

## 2018-02-03 NOTE — Telephone Encounter (Signed)
Pt is scheduled for Sept 13,2019 at 8:40. Done

## 2018-02-05 ENCOUNTER — Other Ambulatory Visit: Payer: Self-pay | Admitting: Medical

## 2018-02-05 ENCOUNTER — Encounter: Payer: Self-pay | Admitting: Medical

## 2018-02-05 ENCOUNTER — Ambulatory Visit: Payer: BLUE CROSS/BLUE SHIELD | Admitting: Medical

## 2018-02-05 VITALS — BP 134/87 | HR 87 | Temp 98.4°F | Resp 16 | Ht 65.0 in | Wt 207.0 lb

## 2018-02-05 DIAGNOSIS — F329 Major depressive disorder, single episode, unspecified: Secondary | ICD-10-CM

## 2018-02-05 DIAGNOSIS — R591 Generalized enlarged lymph nodes: Secondary | ICD-10-CM

## 2018-02-05 DIAGNOSIS — Z79899 Other long term (current) drug therapy: Secondary | ICD-10-CM

## 2018-02-05 DIAGNOSIS — F419 Anxiety disorder, unspecified: Secondary | ICD-10-CM | POA: Diagnosis not present

## 2018-02-05 DIAGNOSIS — F32A Depression, unspecified: Secondary | ICD-10-CM

## 2018-02-05 MED ORDER — ALPRAZOLAM 0.5 MG PO TABS: ORAL_TABLET | ORAL | 2 refills | Status: DC

## 2018-02-05 MED ORDER — AZITHROMYCIN 250 MG PO TABS: ORAL_TABLET | ORAL | 0 refills | Status: DC

## 2018-02-05 MED ORDER — VENLAFAXINE HCL ER 75 MG PO CP24: 75.0000 mg | ORAL_CAPSULE | Freq: Every day | ORAL | 3 refills | Status: DC

## 2018-02-05 NOTE — Patient Instructions (Signed)
Your anxiety and mood is better controlled with Effexor but you are still having intermittent moderate to severe anxiety.  I do think it would be beneficial to increase Effexor to 75 mg daily.  Also will go ahead and provide 15 tablets of Xanax to use as instructed for severe anxiety/panic attacks.  You signed controlled medication contract today and will give UDS.  Continue seeing counselor.  Also regarding the small lymph node below your right ear, this might be related to your molar that needs to be removed in about a month or possible tooth infection.  We will see how you respond to a azithromycin and want to recheck that area in about 3 weeks.  Also would asked that you get your dentist to check the area as he might want to do x-rays of your teeth.  Follow-up in 3 weeks or as needed.

## 2018-02-05 NOTE — Progress Notes (Signed)
Subjective:    Patient ID: Kristine Soto, female    DOB: Mar 02, 1996, 22 y.o.   MRN: [id number]  HPI  Pt in for follow up.  She has seen counselor at [facility] and she finds that helpful. Pt states effexor worked better than other depression/anxiety meds that she has used in past. Pt states last week she was using xanax about every other day. But has not been on xanax for 4 days.   Pt is school at [employer]. School is on of major stressors as well as some family stress as explained in counselor notes.  LMP- January 17, 2018.  Review of Systems  Constitutional: Negative for chills, fatigue and fever.  Respiratory: Negative for cough, chest tightness, shortness of breath and wheezing.   Cardiovascular: Negative for chest pain and palpitations.  Gastrointestinal: Negative for abdominal pain.  Musculoskeletal: Negative for gait problem.  Skin: Negative for rash.  Neurological: Negative for dizziness, seizures, weakness, numbness and headaches.  Hematological: Negative for adenopathy. Does not bruise/bleed easily.       Pt had small lump below rt ear which she notes 3 days ago. She has no ear pain. No sinus pain. She has molar on that side that needs to be removed.  Cbc 2 months ago.  Psychiatric/Behavioral: Negative for behavioral problems, dysphoric mood and sleep disturbance. The patient is nervous/anxious.     Past Medical History:  Diagnosis Date  . Allergy    year round  . Anxiety   . Asthma    when younger but 5 years since any wheezing.  . Depression   . Fibromyalgia   . GAD (generalized anxiety disorder)   . Hyperlipidemia   . Multiple allergies   . Palpitations   . Panic disorder   . Seizures (HCC)   . Trigeminy      Social History   Socioeconomic History  . Marital status: Single    Spouse name: Not on file  . Number of children: Not on file  . Years of education: Not on file  . Highest education level: Not on file  Occupational History  . Not on file    Social Needs  . Financial resource strain: Not on file  . Food insecurity:    Worry: Not on file    Inability: Not on file  . Transportation needs:    Medical: Not on file    Non-medical: Not on file  Tobacco Use  . Smoking status: Never Smoker  . Smokeless tobacco: Never Used  Substance and Sexual Activity  . Alcohol use: No  . Drug use: No  . Sexual activity: Never    Birth control/protection: None  Lifestyle  . Physical activity:    Days per week: Not on file    Minutes per session: Not on file  . Stress: Not on file  Relationships  . Social connections:    Talks on phone: Not on file    Gets together: Not on file    Attends religious service: Not on file    Active member of club or organization: Not on file    Attends meetings of clubs or organizations: Not on file    Relationship status: Not on file  . Intimate partner violence:    Fear of current or ex partner: Not on file    Emotionally abused: Not on file    Physically abused: Not on file    Forced sexual activity: Not on file  Other Topics Concern  . Not  on file  Social History Narrative  . Not on file    No past surgical history on file.  Family History  Problem Relation Age of Onset  . Thyroid disease Mother   . Hyperlipidemia Father     Allergies  Allergen Reactions  . Peanut Oil Anaphylaxis  . Penicillins Anaphylaxis    Has patient had a PCN reaction causing immediate rash, facial/tongue/throat swelling, SOB or lightheadedness with hypotension: unknown Has patient had a PCN reaction causing severe rash involving mucus membranes or skin necrosis:unknown Has patient had a PCN reaction that required hospitalization:unknown Has patient had a PCN reaction occurring within the last 10 years: unknown  Never taken If all of the above answers are "NO", then may proceed with Cephalosporin use.   . Sulfamethoxazole-Trimethoprim Swelling  . Egg White  [Albumen, Egg] Swelling  . Fish Allergy Rash  .  Fish-Derived Products Rash    Current Outpatient Medications on File Prior to Visit  Medication Sig Dispense Refill  . acetaminophen (TYLENOL) 500 MG tablet Take 500 mg by mouth as needed.     Marland Kitchen. albuterol (PROVENTIL HFA;VENTOLIN HFA) 108 (90 Base) MCG/ACT inhaler Inhale into the lungs.    Marland Kitchen. albuterol (PROVENTIL HFA;VENTOLIN HFA) 108 (90 Base) MCG/ACT inhaler Inhale 2 puffs into the lungs every 6 (six) hours as needed for wheezing or shortness of breath. 1 Inhaler 2  . ALPRAZolam (XANAX) 0.5 MG tablet Take 1 tablet (0.5 mg total) by mouth at bedtime as needed for anxiety. 12 tablet 0  . baclofen (LIORESAL) 10 MG tablet Take 1 tablet (10 mg total) by mouth 3 (three) times daily. 30 each 1  . beclomethasone (QVAR REDIHALER) 40 MCG/ACT inhaler Inhale 2 puffs into the lungs 2 (two) times daily. 10.6 g 0  . fluticasone (FLONASE) 50 MCG/ACT nasal spray Place 2 sprays into both nostrils daily. (Patient taking differently: Place 2 sprays into both nostrils as needed. ) 16 g 3  . gabapentin (NEURONTIN) 100 MG capsule TAKE 1 CAPSULE BY MOUTH TWICE A DAY 60 capsule 1  . levocetirizine (XYZAL) 5 MG tablet Take 1 tablet (5 mg total) by mouth every evening. 90 tablet 3  . meloxicam (MOBIC) 7.5 MG tablet Take 1 tablet (7.5 mg total) by mouth daily. 30 tablet 2  . temazepam (RESTORIL) 15 MG capsule TAKE 1 CAPSULE BY MOUTH NIGHTLY AS NEEDED FOR SLEEP    . venlafaxine XR (EFFEXOR-XR) 37.5 MG 24 hr capsule Take 1 capsule (37.5 mg total) by mouth daily with breakfast. 30 capsule 0  . Vitamin D, Ergocalciferol, (DRISDOL) 50000 units CAPS capsule Take 1 capsule by mouth once a week.    . Vitamin D, Ergocalciferol, (DRISDOL) 50000 units CAPS capsule Take 1 capsule (50,000 Units total) by mouth every 7 (seven) days. 8 capsule 0  . metoprolol tartrate (LOPRESSOR) 25 MG tablet Take 0.5 tablets (12.5 mg total) by mouth 2 (two) times daily as needed (Palpitations/chest pain). 30 tablet 3   No current facility-administered  medications on file prior to visit.     BP 134/87   Pulse 87   Temp 98.4 F (36.9 C) (Oral)   Resp 16   Ht 5\' 5"  (1.651 m)   Wt 207 lb (93.9 kg)   SpO2 99%   BMI 34.45 kg/m       Objective:   Physical Exam  General Mental Status- Alert. General Appearance- Not in acute distress.   Skin General: Color- Normal Color. Moisture- Normal Moisture.  Neck Carotid Arteries- Normal color.  Moisture- Normal Moisture. No carotid bruits. No JVD.(upper neck/see ear exam descriptoin node beneath rt ear)  Chest and Lung Exam Auscultation: Breath Sounds:-Normal.  Cardiovascular Auscultation:Rythm- Regular. Murmurs & Other Heart Sounds:Auscultation of the heart reveals- No Murmurs.  Abdomen Inspection:-Inspeection Normal. Palpation/Percussion:Note:No mass. Palpation and Percussion of the abdomen reveal- Non Tender, Non Distended + BS, no rebound or guarding.    Neurologic Cranial Nerve exam:- CN III-XII intact(No nystagmus), symmetric smile. Strength:- 5/5 equal and symmetric strength both upper and lower extremities.   HEENT Head- Normal. Ear Auditory Canal - Left- Normal. Right - Normal.Tympanic Membrane- Left- Normal. Right- Normal.(just beneath rt ear small probabe lymph node. Faint tender. Eye Sclera/Conjunctiva- Left- Normal. Right- Normal. Nose & Sinuses Nasal Mucosa- Left-  Boggy and Congested. Right-  Boggy and  Congested.Bilateral no  maxillary and no  frontal sinus pressure. Mouth & Throat Lips: Upper Lip- Normal: no dryness, cracking, pallor, cyanosis, or vesicular eruption. Lower Lip-Normal: no dryness, cracking, pallor, cyanosis or vesicular eruption. Buccal Mucosa- Bilateral- No Aphthous ulcers. Oropharynx- No Discharge or Erythema. Tonsils: Characteristics- Bilateral- No Erythema or Congestion. Size/Enlargement- Bilateral- No enlargement. Discharge- bilateral-None.      Assessment & Plan:  Your anxiety and mood is better controlled with Effexor but you are  still having intermittent moderate to severe anxiety.  I do think it would be beneficial to increase Effexor to 75 mg daily.  Also will go ahead and provide 15 tablets of Xanax to use as instructed for severe anxiety/panic attacks.  You signed controlled medication contract today and will give UDS.  Continue seeing counselor.  Also regarding the small lymph node below your right ear, this might be related to your molar that needs to be removed in about a month or possible tooth infection.  We will see how you respond to a azithromycin and want to recheck that area in about 3 weeks.  Also would asked that you get your dentist to check the area as he might want to do x-rays of your teeth.  Follow-up in 3 weeks or as needed.  Esperanza Richters, PA-C

## 2018-02-06 LAB — PAIN MGMT, PROFILE 8 W/CONF, U
6 Acetylmorphine: NEGATIVE ng/mL (ref <10)
ALCOHOL METABOLITES: NEGATIVE ng/mL (ref <500)
Amphetamines: NEGATIVE ng/mL (ref <500)
BENZODIAZEPINES: NEGATIVE ng/mL (ref <100)
Buprenorphine, Urine: NEGATIVE ng/mL (ref <5)
COCAINE METABOLITE: NEGATIVE ng/mL (ref <150)
MDMA: NEGATIVE ng/mL (ref <500)
Marijuana Metabolite: NEGATIVE ng/mL (ref <20)
Opiates: NEGATIVE ng/mL (ref <100)
Oxidant: NEGATIVE ug/mL (ref <200)
Oxycodone: NEGATIVE ng/mL (ref <100)
PH: 6.62 (ref 4.5–9.0)

## 2018-02-12 ENCOUNTER — Other Ambulatory Visit: Payer: Self-pay | Admitting: Medical

## 2018-02-15 ENCOUNTER — Ambulatory Visit: Payer: BLUE CROSS/BLUE SHIELD | Admitting: Cardiology

## 2018-02-24 ENCOUNTER — Ambulatory Visit: Payer: BLUE CROSS/BLUE SHIELD | Admitting: Medical

## 2018-02-24 ENCOUNTER — Encounter: Payer: Self-pay | Admitting: Medical

## 2018-02-24 ENCOUNTER — Ambulatory Visit (HOSPITAL_BASED_OUTPATIENT_CLINIC_OR_DEPARTMENT_OTHER)
Admission: RE | Admit: 2018-02-24 | Discharge: 2018-02-24 | Disposition: A | Discharge status: Home / Self Care | Payer: BLUE CROSS/BLUE SHIELD | Source: Ambulatory Visit | Attending: Medical | Admitting: Medical

## 2018-02-24 ENCOUNTER — Encounter: Payer: Self-pay | Admitting: Neurology

## 2018-02-24 VITALS — BP 129/84 | HR 79 | Temp 98.2°F | Resp 16 | Ht 65.0 in | Wt 205.0 lb

## 2018-02-24 DIAGNOSIS — M898X8 Other specified disorders of bone, other site: Secondary | ICD-10-CM | POA: Diagnosis not present

## 2018-02-24 DIAGNOSIS — M2550 Pain in unspecified joint: Secondary | ICD-10-CM

## 2018-02-24 DIAGNOSIS — M542 Cervicalgia: Secondary | ICD-10-CM | POA: Diagnosis not present

## 2018-02-24 DIAGNOSIS — R7989 Other specified abnormal findings of blood chemistry: Secondary | ICD-10-CM | POA: Diagnosis not present

## 2018-02-24 DIAGNOSIS — M6281 Muscle weakness (generalized): Secondary | ICD-10-CM | POA: Diagnosis not present

## 2018-02-24 LAB — SEDIMENTATION RATE: Sed Rate: 16 mm/hr (ref 0–20)

## 2018-02-24 LAB — C-REACTIVE PROTEIN: CRP: 0.6 mg/dL (ref 0.5–20.0)

## 2018-02-24 LAB — VITAMIN D 25 HYDROXY (VIT D DEFICIENCY, FRACTURES): VITD: 42.83 ng/mL (ref 30.00–100.00)

## 2018-02-24 MED ORDER — PREGABALIN 75 MG PO CAPS: 75.0000 mg | ORAL_CAPSULE | Freq: Two times a day (BID) | ORAL | 0 refills | Status: DC

## 2018-02-24 NOTE — Progress Notes (Signed)
Subjective:    Patient ID: Kristine Soto, female    DOB: 1995-07-22, 22 y.o.   MRN: [id number]  HPI  Pt in with  some progressively worse pain in her body that is diffuse all over type pain. She reports some weakness as well(not weak now but was very weak on Monday). Some neck pain on and off. 3 days ago neck pain was prominent.   Pt states on Monday of last she felt fatigue with severe diffuse body pain.  Regarding fatigue pt had work up in early July. Pt vit D was low but other labs ok.  Some joint pains as well reported. No shoulder pain but does report elbow, wrist, knee and ankle pain.  Hx of fibromyalgia and has been on gabapentin. Pt has never been on cymbalta or lyrica.  No eye pain reported, no vision changes no loss of bladder control.  Lmp- Sept 24, 2019.   Review of Systems  Constitutional: Positive for fatigue. Negative for chills and fever.  Eyes: Negative for photophobia and visual disturbance.  Respiratory: Negative for cough, chest tightness, shortness of breath and wheezing.   Cardiovascular: Negative for chest pain and palpitations.  Gastrointestinal: Negative for abdominal pain.  Musculoskeletal: Positive for arthralgias and neck pain.  Skin: Negative for rash.  Neurological: Positive for weakness. Negative for dizziness, syncope, light-headedness and numbness.    Past Medical History:  Diagnosis Date  . Allergy    year round  . Anxiety   . Asthma    when younger but 5 years since any wheezing.  . Depression   . Fibromyalgia   . GAD (generalized anxiety disorder)   . Hyperlipidemia   . Multiple allergies   . Palpitations   . Panic disorder   . Seizures (HCC)   . Trigeminy      Social History   Socioeconomic History  . Marital status: Single    Spouse name: Not on file  . Number of children: Not on file  . Years of education: Not on file  . Highest education level: Not on file  Occupational History  . Not on file  Social Needs  .  Financial resource strain: Not on file  . Food insecurity:    Worry: Not on file    Inability: Not on file  . Transportation needs:    Medical: Not on file    Non-medical: Not on file  Tobacco Use  . Smoking status: Never Smoker  . Smokeless tobacco: Never Used  Substance and Sexual Activity  . Alcohol use: No  . Drug use: No  . Sexual activity: Never    Birth control/protection: None  Lifestyle  . Physical activity:    Days per week: Not on file    Minutes per session: Not on file  . Stress: Not on file  Relationships  . Social connections:    Talks on phone: Not on file    Gets together: Not on file    Attends religious service: Not on file    Active member of club or organization: Not on file    Attends meetings of clubs or organizations: Not on file    Relationship status: Not on file  . Intimate partner violence:    Fear of current or ex partner: Not on file    Emotionally abused: Not on file    Physically abused: Not on file    Forced sexual activity: Not on file  Other Topics Concern  . Not on file  Social History Narrative  . Not on file    No past surgical history on file.  Family History  Problem Relation Age of Onset  . Thyroid disease Mother   . Hyperlipidemia Father     Allergies  Allergen Reactions  . Peanut Oil Anaphylaxis  . Penicillins Anaphylaxis    Has patient had a PCN reaction causing immediate rash, facial/tongue/throat swelling, SOB or lightheadedness with hypotension: unknown Has patient had a PCN reaction causing severe rash involving mucus membranes or skin necrosis:unknown Has patient had a PCN reaction that required hospitalization:unknown Has patient had a PCN reaction occurring within the last 10 years: unknown  Never taken If all of the above answers are "NO", then may proceed with Cephalosporin use.   . Sulfamethoxazole-Trimethoprim Swelling  . Egg White  [Albumen, Egg] Swelling  . Fish Allergy Rash  . Fish-Derived Products  Rash    Current Outpatient Medications on File Prior to Visit  Medication Sig Dispense Refill  . acetaminophen (TYLENOL) 500 MG tablet Take 500 mg by mouth as needed.     Marland Kitchen albuterol (PROVENTIL HFA;VENTOLIN HFA) 108 (90 Base) MCG/ACT inhaler Inhale into the lungs.    Marland Kitchen albuterol (PROVENTIL HFA;VENTOLIN HFA) 108 (90 Base) MCG/ACT inhaler Inhale 2 puffs into the lungs every 6 (six) hours as needed for wheezing or shortness of breath. 1 Inhaler 2  . ALPRAZolam (XANAX) 0.5 MG tablet Take 1 tablet (0.5 mg total) by mouth at bedtime as needed for anxiety. 12 tablet 0  . ALPRAZolam (XANAX) 0.5 MG tablet 1 tab po a day panic attack/severe anxiety 15 tablet 2  . azithromycin (ZITHROMAX) 250 MG tablet Take 2 tablets by mouth on day 1, followed by 1 tablet by mouth daily for 4 days. 6 tablet 0  . baclofen (LIORESAL) 10 MG tablet TAKE 1 TABLET BY MOUTH THREE TIMES A DAY 30 tablet 1  . beclomethasone (QVAR REDIHALER) 40 MCG/ACT inhaler Inhale 2 puffs into the lungs 2 (two) times daily. 10.6 g 0  . fluticasone (FLONASE) 50 MCG/ACT nasal spray Place 2 sprays into both nostrils daily. (Patient taking differently: Place 2 sprays into both nostrils as needed. ) 16 g 3  . gabapentin (NEURONTIN) 100 MG capsule TAKE 1 CAPSULE BY MOUTH TWICE A DAY 60 capsule 1  . levocetirizine (XYZAL) 5 MG tablet Take 1 tablet (5 mg total) by mouth every evening. 90 tablet 3  . meloxicam (MOBIC) 7.5 MG tablet Take 1 tablet (7.5 mg total) by mouth daily. 30 tablet 2  . temazepam (RESTORIL) 15 MG capsule TAKE 1 CAPSULE BY MOUTH NIGHTLY AS NEEDED FOR SLEEP    . venlafaxine XR (EFFEXOR-XR) 75 MG 24 hr capsule Take 1 capsule (75 mg total) by mouth daily with breakfast. 30 capsule 3  . Vitamin D, Ergocalciferol, (DRISDOL) 50000 units CAPS capsule Take 1 capsule by mouth once a week.    . Vitamin D, Ergocalciferol, (DRISDOL) 50000 units CAPS capsule Take 1 capsule (50,000 Units total) by mouth every 7 (seven) days. 8 capsule 0  .  metoprolol tartrate (LOPRESSOR) 25 MG tablet Take 0.5 tablets (12.5 mg total) by mouth 2 (two) times daily as needed (Palpitations/chest pain). 30 tablet 3   No current facility-administered medications on file prior to visit.     BP 129/84   Pulse 79   Temp 98.2 F (36.8 C) (Oral)   Resp 16   Ht 5\' 5"  (1.651 m)   Wt 205 lb (93 kg)   LMP 02/15/2018   SpO2  99%   BMI 34.11 kg/m       Objective:   Physical Exam  General Mental Status- Alert. General Appearance- Not in acute distress.   Skin General: Color- Normal Color. Moisture- Normal Moisture.  Neck Carotid Arteries- Normal color. Moisture- Normal Moisture. Mid cspine and trapezius tenderness.   Chest and Lung Exam Auscultation: Breath Sounds:-Normal.  Cardiovascular Auscultation:Rythm- Regular. Murmurs & Other Heart Sounds:Auscultation of the heart reveals- No Murmurs.  Abdomen Inspection:-Inspeection Normal. Palpation/Percussion:Note:No mass. Palpation and Percussion of the abdomen reveal- Non Tender, Non Distended + BS, no rebound or guarding.    Neurologic Cranial Nerve exam:- CN III-XII intact(No nystagmus), symmetric smile. Strength:- 4/5 equal and symmetric strength both upper and lower extremities.  Joins- elbows, wrist, knees and ankles appear normal but mild pain on range of motion reported.   Back- symmetric paraspinal tenderness to light palpation tspine- lspine.      Assessment & Plan:  You do have some diffuse pain with history of fibromyalgia.  Possible recent fibromyalgia.  I do think is a good idea to prescribe Lyrica.  Hopefully that is covered.  If so then I want you to stop gabapentin and can start Lyrica 24 hours after last gabapentin dose.  For your intermittent episodes of muscle weakness and your expressed concern for neurologic condition, I placed a referral to a neurologist.  If you do not get a call from neurologist and approximate 10 days then please call here for update on that  referral.  If any dramatic diffuse muscle weakness episodes during the interim then recommend ED evaluation.  For history of low vitamin D and fatigue, will repeat vitamin D level today.  For some recent neck pain along with extremity weakness, I do want to get cervical spine x-ray.  Follow-up in 3 to 4 weeks or as needed.  Esperanza Richters, PA-C

## 2018-02-24 NOTE — Patient Instructions (Signed)
You do have some diffuse pain with history of fibromyalgia.  Possible recent fibromyalgia.  I do think is a good idea to prescribe Lyrica.  Hopefully that is covered.  If so then I want you to stop gabapentin and can start Lyrica 24 hours after last gabapentin dose.  For your intermittent episodes of muscle weakness and your expressed concern for neurologic condition, I placed a referral to a neurologist.  If you do not get a call from neurologist and approximate 10 days then please call here for update on that referral.  If any dramatic diffuse muscle weakness episodes during the interim then recommend ED evaluation.  For history of low vitamin D and fatigue, will repeat vitamin D level today.  For some recent neck pain along with extremity weakness, I do want to get cervical spine x-ray.  Follow-up in 3 to 4 weeks or as needed.

## 2018-02-25 LAB — RHEUMATOID FACTOR: Rhuematoid fact SerPl-aCnc: <14 IU/mL (ref <14)

## 2018-02-25 LAB — HLA-B27 ANTIGEN: HLA-B27 Antigen: NEGATIVE

## 2018-02-25 LAB — ANA: Anti Nuclear Antibody(ANA): NEGATIVE

## 2018-02-26 ENCOUNTER — Encounter: Payer: Self-pay | Admitting: Medical

## 2018-03-02 ENCOUNTER — Ambulatory Visit: Payer: BLUE CROSS/BLUE SHIELD | Admitting: Medical

## 2018-03-04 ENCOUNTER — Encounter (INDEPENDENT_AMBULATORY_CARE_PROVIDER_SITE_OTHER): Payer: Self-pay

## 2018-03-04 ENCOUNTER — Ambulatory Visit (INDEPENDENT_AMBULATORY_CARE_PROVIDER_SITE_OTHER): Payer: BLUE CROSS/BLUE SHIELD | Admitting: Cardiology

## 2018-03-04 ENCOUNTER — Encounter: Payer: Self-pay | Admitting: Cardiology

## 2018-03-04 VITALS — BP 110/64 | HR 88 | Ht 65.0 in | Wt 203.0 lb

## 2018-03-04 DIAGNOSIS — R0789 Other chest pain: Secondary | ICD-10-CM | POA: Diagnosis not present

## 2018-03-04 DIAGNOSIS — F410 Panic disorder [episodic paroxysmal anxiety] without agoraphobia: Secondary | ICD-10-CM | POA: Diagnosis not present

## 2018-03-04 MED ORDER — METOPROLOL TARTRATE 25 MG PO TABS: 12.5000 mg | ORAL_TABLET | Freq: Two times a day (BID) | ORAL | 6 refills | Status: AC | PRN

## 2018-03-04 NOTE — Patient Instructions (Signed)
Medication Instructions:  Your physician recommends that you continue on your current medications as directed. Please refer to the Current Medication list given to you today.  If you need a refill on your cardiac medications before your next appointment, please call your pharmacy.   Lab work: None ordered If you have labs (blood work) drawn today and your tests are completely normal, you will receive your results only by: . MyChart Message (if you have MyChart) OR . A paper copy in the mail If you have any lab test that is abnormal or we need to change your treatment, we will call you to review the results.  Testing/Procedures: None ordered  Follow-Up: At [facility], you and your health needs are our priority.  As part of our continuing mission to provide you with exceptional heart care, we have created designated Provider Care Teams.  These Care Teams include your primary Cardiologist (physician) and Advanced Practice Providers (APPs -  Physician Assistants and Nurse Practitioners) who all work together to provide you with the care you need, when you need it. You will need a follow up appointment in 4 months.  Please call our office 2 months in advance to schedule this appointment.  You may see  Robert Krasowski or another member of our [facility] Provider Team in [place]: Brian Munley, MD . Rajan Revankar, MD  Any Other Special Instructions Will Be Listed Below (If Applicable).    

## 2018-03-04 NOTE — Progress Notes (Signed)
Cardiology Office Note:    Date:  03/04/2018   ID:  Kristine Soto, DOB 23-Nov-1995, MRN [id number]  PCP:  Esperanza Richters, PA-C  Cardiologist:  Gypsy Balsam, MD    Referring MD: Marisue Brooklyn   Chief Complaint  Patient presents with  . Palpitations    2-3 times a months  Doing better but still has some palpitations may be 2-3 times a month  History of Present Illness:    Kristine Soto is a 22 y.o. female with palpitations with extrasystole managed with very small dose of beta-blocker still have some breakthrough arrhythmia does not bother her much.  Denies have any chest pain tightness squeezing pressure burning chest overall seems to be doing well  Past Medical History:  Diagnosis Date  . Allergy    year round  . Anxiety   . Asthma    when younger but 5 years since any wheezing.  . Depression   . Fibromyalgia   . GAD (generalized anxiety disorder)   . Hyperlipidemia   . Multiple allergies   . Palpitations   . Panic disorder   . Seizures ([facility])   . Trigeminy     No past surgical history on file.  Current Medications: Current Meds  Medication Sig  . acetaminophen (TYLENOL) 500 MG tablet Take 500 mg by mouth as needed.   Marland Kitchen albuterol (PROVENTIL HFA;VENTOLIN HFA) 108 (90 Base) MCG/ACT inhaler Inhale into the lungs.  Marland Kitchen albuterol (PROVENTIL HFA;VENTOLIN HFA) 108 (90 Base) MCG/ACT inhaler Inhale 2 puffs into the lungs every 6 (six) hours as needed for wheezing or shortness of breath.  . ALPRAZolam (XANAX) 0.5 MG tablet Take 1 tablet (0.5 mg total) by mouth at bedtime as needed for anxiety.  . ALPRAZolam (XANAX) 0.5 MG tablet 1 tab po a day panic attack/severe anxiety  . azithromycin (ZITHROMAX) 250 MG tablet Take 2 tablets by mouth on day 1, followed by 1 tablet by mouth daily for 4 days.  . baclofen (LIORESAL) 10 MG tablet TAKE 1 TABLET BY MOUTH THREE TIMES A DAY  . beclomethasone (QVAR REDIHALER) 40 MCG/ACT inhaler Inhale 2 puffs into the lungs 2 (two)  times daily.  . fluticasone (FLONASE) 50 MCG/ACT nasal spray Place 2 sprays into both nostrils daily. (Patient taking differently: Place 2 sprays into both nostrils as needed. )  . gabapentin (NEURONTIN) 100 MG capsule TAKE 1 CAPSULE BY MOUTH TWICE A DAY  . levocetirizine (XYZAL) 5 MG tablet Take 1 tablet (5 mg total) by mouth every evening.  . meloxicam (MOBIC) 7.5 MG tablet Take 1 tablet (7.5 mg total) by mouth daily.  . pregabalin (LYRICA) 75 MG capsule Take 1 capsule (75 mg total) by mouth 2 (two) times daily.  . temazepam (RESTORIL) 15 MG capsule TAKE 1 CAPSULE BY MOUTH NIGHTLY AS NEEDED FOR SLEEP  . venlafaxine XR (EFFEXOR-XR) 75 MG 24 hr capsule Take 1 capsule (75 mg total) by mouth daily with breakfast.  . Vitamin D, Ergocalciferol, (DRISDOL) 50000 units CAPS capsule Take 1 capsule by mouth once a week.  . Vitamin D, Ergocalciferol, (DRISDOL) 50000 units CAPS capsule Take 1 capsule (50,000 Units total) by mouth every 7 (seven) days.     Allergies:   Peanut oil; Penicillins; Sulfamethoxazole-trimethoprim; Egg white  [albumen, egg]; Fish allergy; and Fish-derived products   Social History   Socioeconomic History  . Marital status: Single    Spouse name: Not on file  . Number of children: Not on file  . Years of education:  Not on file  . Highest education level: Not on file  Occupational History  . Not on file  Social Needs  . Financial resource strain: Not on file  . Food insecurity:    Worry: Not on file    Inability: Not on file  . Transportation needs:    Medical: Not on file    Non-medical: Not on file  Tobacco Use  . Smoking status: Never Smoker  . Smokeless tobacco: Never Used  Substance and Sexual Activity  . Alcohol use: No  . Drug use: No  . Sexual activity: Never    Birth control/protection: None  Lifestyle  . Physical activity:    Days per week: Not on file    Minutes per session: Not on file  . Stress: Not on file  Relationships  . Social connections:      Talks on phone: Not on file    Gets together: Not on file    Attends religious service: Not on file    Active member of club or organization: Not on file    Attends meetings of clubs or organizations: Not on file    Relationship status: Not on file  Other Topics Concern  . Not on file  Social History Narrative  . Not on file     Family History: The patient's family history includes Hyperlipidemia in her father; Thyroid disease in her mother. ROS:   Please see the history of present illness.    All 14 point review of systems negative except as described per history of present illness  EKGs/Labs/Other Studies Reviewed:      Recent Labs: 11/24/2017: ALT 23; BUN 6; Creatinine, Ser 0.77; Hemoglobin 14.8; Platelets 356.0; Potassium 4.3; Sodium 138; TSH 0.48  Recent Lipid Panel    Component Value Date/Time   CHOL 185 03/18/2017 1050   CHOL 193 12/19/2016 1154   TRIG 98.0 03/18/2017 1050   HDL 52.40 03/18/2017 1050   HDL 47 12/19/2016 1154   CHOLHDL 4 03/18/2017 1050   VLDL 19.6 03/18/2017 1050   LDLCALC 113 (H) 03/18/2017 1050   LDLCALC 138 (H) 12/19/2016 1154    Physical Exam:    VS:  BP 110/64   Pulse 88   Ht 5\' 5"  (1.651 m)   Wt 203 lb (92.1 kg)   LMP 02/15/2018   SpO2 98%   BMI 33.78 kg/m     Wt Readings from Last 3 Encounters:  03/04/18 203 lb (92.1 kg)  02/24/18 205 lb (93 kg)  02/05/18 207 lb (93.9 kg)     GEN:  Well nourished, well developed in no acute distress HEENT: Normal NECK: No JVD; No carotid bruits LYMPHATICS: No lymphadenopathy CARDIAC: RRR, no murmurs, no rubs, no gallops RESPIRATORY:  Clear to auscultation without rales, wheezing or rhonchi  ABDOMEN: Soft, non-tender, non-distended MUSCULOSKELETAL:  No edema; No deformity  SKIN: Warm and dry LOWER EXTREMITIES: no swelling NEUROLOGIC:  Alert and oriented x 3 PSYCHIATRIC:  Normal affect   ASSESSMENT:    1. Atypical chest pain   2. Panic disorder    PLAN:    In order of problems  listed above:  1. Palpitations I told her that the 2 options we have in the situation option #1 will be just simply continue doing what we are doing another options to augment therapy and that to wait to do it one is to take extra half a tablet of metoprolol on as-needed basis and second way is to simply double the dose of  metoprolol.  She will make a decision which way she would like to go. 2. Panic disorder that is being treated with good results.  Will continue. 3. We started talking about exercising a little bit more she goes on elliptical twice a week for one half an hour doing well with the I told her probably will be beneficial to do more.   Medication Adjustments/Labs and Tests Ordered: Current medicines are reviewed at length with the patient today.  Concerns regarding medicines are outlined above.  No orders of the defined types were placed in this encounter.  Medication changes:  Meds ordered this encounter  Medications  . metoprolol tartrate (LOPRESSOR) 25 MG tablet    Sig: Take 0.5 tablets (12.5 mg total) by mouth 2 (two) times daily as needed (Palpitations/chest pain).    Dispense:  30 tablet    Refill:  6    Signed, Georgeanna Lea, MD, [facility] 03/04/2018 11:06 AM    [facility]

## 2018-03-04 NOTE — Progress Notes (Deleted)
NEUROLOGY CONSULTATION NOTE  Kristine Soto MRN: [id number] DOB: June 30, 1995  Referring provider: Evern Core, PA-C Primary care provider: Evern Core, PA-C  Reason for consult:  Evaluate for MS  HISTORY OF PRESENT ILLNESS: Kristine Soto is a 22 year old ***-handed female with depression, anxiety, fibromyalgia who presents for evaluation of multiple sclerosis.  History supplemented by referring provider's note.  She has diagnosis of fibromyalgia.  She reports diffuse body pain with myalgia and including neck.  She also reports fatigue and has D deficiency.  She reports episodes of muscle weakness ***.  Labs from 11/24/17 include:  B12 1,012, TSH 0.48, T4 0.86, B1 14, iron 112, and D 25-hydroxy 20.22.  She was started on D supplementation.  Labs from 02/24/18 include:  D 25-hydroxy 42.83, sed rate 16, CRP 0.6, ANA negative, RF negative,  HLA-B27 antigen  PAST MEDICAL HISTORY: Past Medical History:  Diagnosis Date  . Allergy    year round  . Anxiety   . Asthma    when younger but 5 years since any wheezing.  . Depression   . Fibromyalgia   . GAD (generalized anxiety disorder)   . Hyperlipidemia   . Multiple allergies   . Palpitations   . Panic disorder   . Seizures ([place])   . Trigeminy     PAST SURGICAL HISTORY: No past surgical history on file.  MEDICATIONS: Current Outpatient Medications on File Prior to Visit  Medication Sig Dispense Refill  . acetaminophen (TYLENOL) 500 MG tablet Take 500 mg by mouth as needed.     Marland Kitchen albuterol (PROVENTIL HFA;VENTOLIN HFA) 108 (90 Base) MCG/ACT inhaler Inhale into the lungs.    Marland Kitchen albuterol (PROVENTIL HFA;VENTOLIN HFA) 108 (90 Base) MCG/ACT inhaler Inhale 2 puffs into the lungs every 6 (six) hours as needed for wheezing or shortness of breath. 1 Inhaler 2  . ALPRAZolam (XANAX) 0.5 MG tablet Take 1 tablet (0.5 mg total) by mouth at bedtime as needed for anxiety. 12 tablet 0  . ALPRAZolam (XANAX) 0.5 MG tablet 1 tab po a day panic  attack/severe anxiety 15 tablet 2  . azithromycin (ZITHROMAX) 250 MG tablet Take 2 tablets by mouth on day 1, followed by 1 tablet by mouth daily for 4 days. 6 tablet 0  . baclofen (LIORESAL) 10 MG tablet TAKE 1 TABLET BY MOUTH THREE TIMES A DAY 30 tablet 1  . beclomethasone (QVAR REDIHALER) 40 MCG/ACT inhaler Inhale 2 puffs into the lungs 2 (two) times daily. 10.6 g 0  . fluticasone (FLONASE) 50 MCG/ACT nasal spray Place 2 sprays into both nostrils daily. (Patient taking differently: Place 2 sprays into both nostrils as needed. ) 16 g 3  . gabapentin (NEURONTIN) 100 MG capsule TAKE 1 CAPSULE BY MOUTH TWICE A DAY 60 capsule 1  . levocetirizine (XYZAL) 5 MG tablet Take 1 tablet (5 mg total) by mouth every evening. 90 tablet 3  . meloxicam (MOBIC) 7.5 MG tablet Take 1 tablet (7.5 mg total) by mouth daily. 30 tablet 2  . metoprolol tartrate (LOPRESSOR) 25 MG tablet Take 0.5 tablets (12.5 mg total) by mouth 2 (two) times daily as needed (Palpitations/chest pain). 30 tablet 3  . pregabalin (LYRICA) 75 MG capsule Take 1 capsule (75 mg total) by mouth 2 (two) times daily. 60 capsule 0  . temazepam (RESTORIL) 15 MG capsule TAKE 1 CAPSULE BY MOUTH NIGHTLY AS NEEDED FOR SLEEP    . venlafaxine XR (EFFEXOR-XR) 75 MG 24 hr capsule Take 1 capsule (75 mg total)  by mouth daily with breakfast. 30 capsule 3  . Vitamin D, Ergocalciferol, (DRISDOL) 50000 units CAPS capsule Take 1 capsule by mouth once a week.    . Vitamin D, Ergocalciferol, (DRISDOL) 50000 units CAPS capsule Take 1 capsule (50,000 Units total) by mouth every 7 (seven) days. 8 capsule 0   No current facility-administered medications on file prior to visit.     ALLERGIES: Allergies  Allergen Reactions  . Peanut Oil Anaphylaxis  . Penicillins Anaphylaxis    Has patient had a PCN reaction causing immediate rash, facial/tongue/throat swelling, SOB or lightheadedness with hypotension: unknown Has patient had a PCN reaction causing severe rash  involving mucus membranes or skin necrosis:unknown Has patient had a PCN reaction that required hospitalization:unknown Has patient had a PCN reaction occurring within the last 10 years: unknown  Never taken If all of the above answers are "NO", then may proceed with Cephalosporin use.   . Sulfamethoxazole-Trimethoprim Swelling  . Egg White  [Albumen, Egg] Swelling  . Fish Allergy Rash  . Fish-Derived Products Rash    FAMILY HISTORY: Family History  Problem Relation Age of Onset  . Thyroid disease Mother   . Hyperlipidemia Father    ***.  SOCIAL HISTORY: Social History   Socioeconomic History  . Marital status: Single    Spouse name: Not on file  . Number of children: Not on file  . Years of education: Not on file  . Highest education level: Not on file  Occupational History  . Not on file  Social Needs  . Financial resource strain: Not on file  . Food insecurity:    Worry: Not on file    Inability: Not on file  . Transportation needs:    Medical: Not on file    Non-medical: Not on file  Tobacco Use  . Smoking status: Never Smoker  . Smokeless tobacco: Never Used  Substance and Sexual Activity  . Alcohol use: No  . Drug use: No  . Sexual activity: Never    Birth control/protection: None  Lifestyle  . Physical activity:    Days per week: Not on file    Minutes per session: Not on file  . Stress: Not on file  Relationships  . Social connections:    Talks on phone: Not on file    Gets together: Not on file    Attends religious service: Not on file    Active member of club or organization: Not on file    Attends meetings of clubs or organizations: Not on file    Relationship status: Not on file  . Intimate partner violence:    Fear of current or ex partner: Not on file    Emotionally abused: Not on file    Physically abused: Not on file    Forced sexual activity: Not on file  Other Topics Concern  . Not on file  Social History Narrative  . Not on file      REVIEW OF SYSTEMS: Constitutional: No fevers, chills, or sweats, no generalized fatigue, change in appetite Eyes: No visual changes, double vision, eye pain Ear, nose and throat: No hearing loss, ear pain, nasal congestion, sore throat Cardiovascular: No chest pain, palpitations Respiratory:  No shortness of breath at rest or with exertion, wheezes GastrointestinaI: No nausea, vomiting, diarrhea, abdominal pain, fecal incontinence Genitourinary:  No dysuria, urinary retention or frequency Musculoskeletal:  No neck pain, back pain Integumentary: No rash, pruritus, skin lesions Neurological: as above Psychiatric: No depression, insomnia, anxiety Endocrine:  No palpitations, fatigue, diaphoresis, mood swings, change in appetite, change in weight, increased thirst Hematologic/Lymphatic:  No purpura, petechiae. Allergic/Immunologic: no itchy/runny eyes, nasal congestion, recent allergic reactions, rashes  PHYSICAL EXAM: *** General: No acute distress.  Patient appears ***-groomed.  *** Head:  Normocephalic/atraumatic Eyes:  fundi examined but not visualized Neck: supple, no paraspinal tenderness, full range of motion Back: No paraspinal tenderness Heart: regular rate and rhythm Lungs: Clear to auscultation bilaterally. Vascular: No carotid bruits. Neurological Exam: Mental status: alert and oriented to person, place, and time, recent and remote memory intact, fund of knowledge intact, attention and concentration intact, speech fluent and not dysarthric, language intact. Cranial nerves: CN I: not tested CN II: pupils equal, round and reactive to light, visual fields intact CN III, IV, VI:  full range of motion, no nystagmus, no ptosis CN V: facial sensation intact CN VII: upper and lower face symmetric CN VIII: hearing intact CN IX, X: gag intact, uvula midline CN XI: sternocleidomastoid and trapezius muscles intact CN XII: tongue midline Bulk & Tone: normal, no  fasciculations. Motor:  5/5 throughout *** Sensation:  Pinprick *** temperature *** and vibration sensation intact.  ***. Deep Tendon Reflexes:  2+ throughout, *** toes downgoing.  *** Finger to nose testing:  Without dysmetria.  *** Heel to shin:  Without dysmetria.  *** Gait:  Normal station and stride.  Able to turn and tandem walk. Romberg ***.  IMPRESSION: ***  PLAN: ***  Thank you for allowing me to take part in the care of this patient.  Metta Clines, DO  CC: ***

## 2018-03-05 ENCOUNTER — Encounter

## 2018-03-05 ENCOUNTER — Ambulatory Visit: Payer: BLUE CROSS/BLUE SHIELD | Admitting: Neurology

## 2018-03-05 ENCOUNTER — Telehealth: Payer: Self-pay

## 2018-03-05 NOTE — Telephone Encounter (Signed)
Copied from CRM [id number]. Topic: Referral - Request for Referral >> Mar 05, 2018  2:16 PM Gaynelle Adu wrote: Has patient seen PCP for this complaint? yes Referral for which specialty:  Neurology   Reason for referral: Recent diffuse body aches with extreme episodes of  Muscle weakness and stiffness. Family expresses concern MS or other condidtion.   A referral was sent but the patient cant not be seen until 12-19, she is in pain, and hoping to find another location as soon as possible. Please advise

## 2018-03-07 NOTE — Telephone Encounter (Signed)
Ask her to make appointment so I can reassess and perform physical exam. Can try to move up exam but do thinks best to do re-examen.

## 2018-03-08 NOTE — Telephone Encounter (Signed)
Pt called back.  OV scheduled for 10/16.

## 2018-03-08 NOTE — Telephone Encounter (Signed)
LVM for pt to call office to schedule an appt with provider.  °

## 2018-03-10 ENCOUNTER — Ambulatory Visit: Payer: BLUE CROSS/BLUE SHIELD | Admitting: Medical

## 2018-03-10 ENCOUNTER — Encounter: Payer: Self-pay | Admitting: Medical

## 2018-03-10 VITALS — BP 125/84 | HR 81 | Temp 98.7°F | Resp 16 | Ht 65.0 in | Wt 204.8 lb

## 2018-03-10 DIAGNOSIS — M6281 Muscle weakness (generalized): Secondary | ICD-10-CM

## 2018-03-10 DIAGNOSIS — M797 Fibromyalgia: Secondary | ICD-10-CM

## 2018-03-10 DIAGNOSIS — M2550 Pain in unspecified joint: Secondary | ICD-10-CM

## 2018-03-10 MED ORDER — PREGABALIN 75 MG PO CAPS: 75.0000 mg | ORAL_CAPSULE | Freq: Three times a day (TID) | ORAL | 0 refills | Status: DC

## 2018-03-10 NOTE — Progress Notes (Signed)
Subjective:    Patient ID: Kristine Soto, female    DOB: 1995/06/21, 21 y.o.   MRN: [id number]  HPI  Pt still feels weak and fatigued. She has more diffuse body aches and fatigue just recently. But symptoms do fluctuate day to day. She is not having eye pain or bladder control issues.  On last visit HPI reads. "Pt in with  some progressively worse pain in her body that is diffuse all over type pain. She reports some weakness as well(not weak now but was very weak on Monday). Some neck pain on and off. 3 days ago neck pain was prominent.   Pt states on Monday of last she felt fatigue with severe diffuse body pain.  Regarding fatigue pt had work up in early July. Pt vit D was low but other labs ok.  Some joint pains as well reported. No shoulder pain but does report elbow, wrist, knee and ankle pain.  Hx of fibromyalgia and has been on gabapentin. Pt has never been on cymbalta or lyrica.  No eye pain reported, no vision changes no loss of bladder control.  Lmp- Sept 24, 2019"  Pt was able to get lyrica filled and she thinks it helps about as much as former gabapentin.   Pt is seeing psychiatrist. DX. 1. Persistent depressive disorder  2. GAD (generalized anxiety disorder)  3. Social anxiety disorder  4. Panic disorder  5. Fibromyalgia  6. Parent-child conflict     Review of Systems  Constitutional: Positive for fatigue. Negative for chills and fever.  Eyes: Negative for photophobia and visual disturbance.  Respiratory: Negative for cough, chest tightness, shortness of breath and wheezing.   Cardiovascular: Negative for chest pain and palpitations.  Gastrointestinal: Negative for abdominal pain.  Musculoskeletal: Positive for arthralgias and neck pain.  Skin: Negative for rash.  Neurological: Positive for weakness. Negative for dizziness, syncope, light-headedness and numbness.       Objective:   Physical Exam  General Mental Status- Alert. General  Appearance- Not in acute distress.   Skin General: Color- Normal Color. Moisture- Normal Moisture.  Neck Carotid Arteries- Normal color. Moisture- Normal Moisture. Mid cspine and trapezius tenderness.   Chest and Lung Exam Auscultation: Breath Sounds:-Normal.  Cardiovascular Auscultation:Rythm- Regular. Murmurs & Other Heart Sounds:Auscultation of the heart reveals- No Murmurs.  Abdomen Inspection:-Inspeection Normal. Palpation/Percussion:Note:No mass. Palpation and Percussion of the abdomen reveal- Non Tender, Non Distended + BS, no rebound or guarding.    Neurologic Cranial Nerve exam:- CN III-XII intact(No nystagmus), symmetric smile. Strength:- 4/5 equal and symmetric strength both upper and lower extremities.  Joins- elbows, wrist, knees and ankles appear normal but mild pain on range of motion reported.   Back- symmetric paraspinal tenderness to light palpation tspine- lspine.      Assessment & Plan:  For your fibromyalgia, you report some decreased pain with Lyrica.  I do think it would be beneficial to increase your Lyrica to 75 mg 3 times daily.  I am providing print prescription of this and you can fill this when you run out of your current prescription.  For your diffuse muscle weakness and concern for MS, I do want you to call the neurologist office that had offered you an appointment in early December.  Go ahead and take that appointment and notify me of that date.  Also please make sure that you are on the cancellation list.  When you give me the day of your appointment I could then asked referral staff  to call around to see if other neurologist had anything sooner.  I do want you to make sure that if you have any dramatic muscle weakness or neurologic symptoms then be seen at the main emergency department with [facility] as sometimes they could arrange neurologic consult and do work-up as well.  Physical exam today does not emergent work-up.  Follow-up in 3 to  4 weeks or as needed.  Esperanza Richters, PA-C

## 2018-03-10 NOTE — Patient Instructions (Signed)
For your fibromyalgia, you report some decreased pain with Lyrica.  I do think it would be beneficial to increase your Lyrica to 75 mg 3 times daily.  I am providing print prescription of this and you can fill this when you run out of your current prescription.  For your diffuse muscle weakness and concern for MS, I do want you to call the neurologist office that had offered you an appointment in early December.  Go ahead and take that appointment and notify me of that date.  Also please make sure that you are on the cancellation list.  When you give me the day of your appointment I could then asked referral staff to call around to see if other neurologist had anything sooner.  I do want you to make sure that if you have any dramatic muscle weakness or neurologic symptoms then be seen at the main emergency department with [facility] as sometimes they could arrange neurologic consult and do work-up as well.  Physical exam today does not emergent work-up.  Follow-up in 3 to 4 weeks or as needed.

## 2018-03-16 ENCOUNTER — Telehealth: Payer: Self-pay | Admitting: Medical

## 2018-03-16 ENCOUNTER — Encounter: Payer: Self-pay | Admitting: Medical

## 2018-03-16 NOTE — Telephone Encounter (Signed)
Patient mother dropping off handicap placard to be filled out by [employer]. Placed in providers tray up front. Please call mother Kristine Soto when form completed.

## 2018-03-17 NOTE — Progress Notes (Deleted)
NEUROLOGY CONSULTATION NOTE  Kristine Soto MRN: [id number] DOB: 1995-08-03  Referring provider: Evern Core, PA-C Primary care provider: Evern Core, PA-C  Reason for consult:  Evaluate for MS  HISTORY OF PRESENT ILLNESS: Kristine Soto is a 22 year old ***-handed female with depression, anxiety, fibromyalgia who presents for evaluation of multiple sclerosis.  History supplemented by referring provider's note.  She has diagnosis of fibromyalgia.  She reports diffuse body pain with myalgia and including neck.  She also reports fatigue and has D deficiency.  She reports episodes of muscle weakness ***.  Labs from 11/24/17 include:  B12 1,012, TSH 0.48, T4 0.86, B1 14, iron 112, and D 25-hydroxy 20.22.  She was started on D supplementation.  Labs from 02/24/18 include:  D 25-hydroxy 42.83, sed rate 16, CRP 0.6, ANA negative, RF negative,  HLA-B27 antigen  PAST MEDICAL HISTORY: Past Medical History:  Diagnosis Date  . Allergy    year round  . Anxiety   . Asthma    when younger but 5 years since any wheezing.  . Depression   . Fibromyalgia   . GAD (generalized anxiety disorder)   . Hyperlipidemia   . Multiple allergies   . Palpitations   . Panic disorder   . Seizures ([place])   . Trigeminy     PAST SURGICAL HISTORY: No past surgical history on file.  MEDICATIONS: Current Outpatient Medications on File Prior to Visit  Medication Sig Dispense Refill  . acetaminophen (TYLENOL) 500 MG tablet Take 500 mg by mouth as needed.     Marland Kitchen albuterol (PROVENTIL HFA;VENTOLIN HFA) 108 (90 Base) MCG/ACT inhaler Inhale into the lungs.    Marland Kitchen albuterol (PROVENTIL HFA;VENTOLIN HFA) 108 (90 Base) MCG/ACT inhaler Inhale 2 puffs into the lungs every 6 (six) hours as needed for wheezing or shortness of breath. 1 Inhaler 2  . ALPRAZolam (XANAX) 0.5 MG tablet Take 1 tablet (0.5 mg total) by mouth at bedtime as needed for anxiety. 12 tablet 0  . ALPRAZolam (XANAX) 0.5 MG tablet 1 tab po a day panic  attack/severe anxiety 15 tablet 2  . azithromycin (ZITHROMAX) 250 MG tablet Take 2 tablets by mouth on day 1, followed by 1 tablet by mouth daily for 4 days. 6 tablet 0  . baclofen (LIORESAL) 10 MG tablet TAKE 1 TABLET BY MOUTH THREE TIMES A DAY 30 tablet 1  . beclomethasone (QVAR REDIHALER) 40 MCG/ACT inhaler Inhale 2 puffs into the lungs 2 (two) times daily. 10.6 g 0  . fluticasone (FLONASE) 50 MCG/ACT nasal spray Place 2 sprays into both nostrils daily. (Patient taking differently: Place 2 sprays into both nostrils as needed. ) 16 g 3  . gabapentin (NEURONTIN) 100 MG capsule TAKE 1 CAPSULE BY MOUTH TWICE A DAY 60 capsule 1  . levocetirizine (XYZAL) 5 MG tablet Take 1 tablet (5 mg total) by mouth every evening. 90 tablet 3  . meloxicam (MOBIC) 7.5 MG tablet Take 1 tablet (7.5 mg total) by mouth daily. 30 tablet 2  . metoprolol tartrate (LOPRESSOR) 25 MG tablet Take 0.5 tablets (12.5 mg total) by mouth 2 (two) times daily as needed (Palpitations/chest pain). 30 tablet 6  . pregabalin (LYRICA) 75 MG capsule Take 1 capsule (75 mg total) by mouth 2 (two) times daily. 60 capsule 0  . pregabalin (LYRICA) 75 MG capsule Take 1 capsule (75 mg total) by mouth 3 (three) times daily. Can fill when current prescription expires.(not sooner) 90 capsule 0  . temazepam (RESTORIL) 15 MG  capsule TAKE 1 CAPSULE BY MOUTH NIGHTLY AS NEEDED FOR SLEEP    . venlafaxine XR (EFFEXOR-XR) 75 MG 24 hr capsule Take 1 capsule (75 mg total) by mouth daily with breakfast. 30 capsule 3  . Vitamin D, Ergocalciferol, (DRISDOL) 50000 units CAPS capsule Take 1 capsule by mouth once a week.    . Vitamin D, Ergocalciferol, (DRISDOL) 50000 units CAPS capsule Take 1 capsule (50,000 Units total) by mouth every 7 (seven) days. 8 capsule 0   No current facility-administered medications on file prior to visit.     ALLERGIES: Allergies  Allergen Reactions  . Peanut Oil Anaphylaxis  . Penicillins Anaphylaxis    Has patient had a PCN  reaction causing immediate rash, facial/tongue/throat swelling, SOB or lightheadedness with hypotension: unknown Has patient had a PCN reaction causing severe rash involving mucus membranes or skin necrosis:unknown Has patient had a PCN reaction that required hospitalization:unknown Has patient had a PCN reaction occurring within the last 10 years: unknown  Never taken If all of the above answers are "NO", then may proceed with Cephalosporin use.   . Sulfamethoxazole-Trimethoprim Swelling  . Egg White  [Albumen, Egg] Swelling  . Fish Allergy Rash  . Fish-Derived Products Rash    FAMILY HISTORY: Family History  Problem Relation Age of Onset  . Thyroid disease Mother   . Hyperlipidemia Father    ***.  SOCIAL HISTORY: Social History   Socioeconomic History  . Marital status: Single    Spouse name: Not on file  . Number of children: Not on file  . Years of education: Not on file  . Highest education level: Not on file  Occupational History  . Not on file  Social Needs  . Financial resource strain: Not on file  . Food insecurity:    Worry: Not on file    Inability: Not on file  . Transportation needs:    Medical: Not on file    Non-medical: Not on file  Tobacco Use  . Smoking status: Never Smoker  . Smokeless tobacco: Never Used  Substance and Sexual Activity  . Alcohol use: No  . Drug use: No  . Sexual activity: Never    Birth control/protection: None  Lifestyle  . Physical activity:    Days per week: Not on file    Minutes per session: Not on file  . Stress: Not on file  Relationships  . Social connections:    Talks on phone: Not on file    Gets together: Not on file    Attends religious service: Not on file    Active member of club or organization: Not on file    Attends meetings of clubs or organizations: Not on file    Relationship status: Not on file  . Intimate partner violence:    Fear of current or ex partner: Not on file    Emotionally abused: Not  on file    Physically abused: Not on file    Forced sexual activity: Not on file  Other Topics Concern  . Not on file  Social History Narrative  . Not on file    REVIEW OF SYSTEMS: Constitutional: No fevers, chills, or sweats, no generalized fatigue, change in appetite Eyes: No visual changes, double vision, eye pain Ear, nose and throat: No hearing loss, ear pain, nasal congestion, sore throat Cardiovascular: No chest pain, palpitations Respiratory:  No shortness of breath at rest or with exertion, wheezes GastrointestinaI: No nausea, vomiting, diarrhea, abdominal pain, fecal incontinence Genitourinary:  No dysuria, urinary retention or frequency Musculoskeletal:  No neck pain, back pain Integumentary: No rash, pruritus, skin lesions Neurological: as above Psychiatric: No depression, insomnia, anxiety Endocrine: No palpitations, fatigue, diaphoresis, mood swings, change in appetite, change in weight, increased thirst Hematologic/Lymphatic:  No purpura, petechiae. Allergic/Immunologic: no itchy/runny eyes, nasal congestion, recent allergic reactions, rashes  PHYSICAL EXAM: *** General: No acute distress.  Patient appears ***-groomed.  *** Head:  Normocephalic/atraumatic Eyes:  fundi examined but not visualized Neck: supple, no paraspinal tenderness, full range of motion Back: No paraspinal tenderness Heart: regular rate and rhythm Lungs: Clear to auscultation bilaterally. Vascular: No carotid bruits. Neurological Exam: Mental status: alert and oriented to person, place, and time, recent and remote memory intact, fund of knowledge intact, attention and concentration intact, speech fluent and not dysarthric, language intact. Cranial nerves: CN I: not tested CN II: pupils equal, round and reactive to light, visual fields intact CN III, IV, VI:  full range of motion, no nystagmus, no ptosis CN V: facial sensation intact CN VII: upper and lower face symmetric CN VIII: hearing  intact CN IX, X: gag intact, uvula midline CN XI: sternocleidomastoid and trapezius muscles intact CN XII: tongue midline Bulk & Tone: normal, no fasciculations. Motor:  5/5 throughout *** Sensation:  Pinprick *** temperature *** and vibration sensation intact.  ***. Deep Tendon Reflexes:  2+ throughout, *** toes downgoing.  *** Finger to nose testing:  Without dysmetria.  *** Heel to shin:  Without dysmetria.  *** Gait:  Normal station and stride.  Able to turn and tandem walk. Romberg ***.  IMPRESSION: ***  PLAN: ***  Thank you for allowing me to take part in the care of this patient.  Metta Clines, DO  CC: ***

## 2018-03-18 ENCOUNTER — Ambulatory Visit (INDEPENDENT_AMBULATORY_CARE_PROVIDER_SITE_OTHER): Payer: Self-pay | Admitting: Family Medicine

## 2018-03-18 ENCOUNTER — Ambulatory Visit: Payer: BLUE CROSS/BLUE SHIELD | Admitting: Neurology

## 2018-03-18 NOTE — Telephone Encounter (Signed)
Completed Physician section on Disability Parking Placard Application; forwarded to provider/SLS 10/24  

## 2018-03-31 ENCOUNTER — Ambulatory Visit: Payer: BLUE CROSS/BLUE SHIELD | Admitting: Medical

## 2018-04-12 ENCOUNTER — Emergency Department (HOSPITAL_COMMUNITY)
Admission: EM | Admit: 2018-04-12 | Discharge: 2018-04-13 | Disposition: A | Discharge status: Home / Self Care | Payer: BLUE CROSS/BLUE SHIELD | Attending: Emergency Medicine | Admitting: Emergency Medicine

## 2018-04-12 ENCOUNTER — Other Ambulatory Visit: Payer: Self-pay

## 2018-04-12 ENCOUNTER — Encounter (HOSPITAL_COMMUNITY): Payer: Self-pay | Admitting: Emergency Medicine

## 2018-04-12 ENCOUNTER — Emergency Department (HOSPITAL_COMMUNITY): Payer: BLUE CROSS/BLUE SHIELD

## 2018-04-12 DIAGNOSIS — Z91010 Allergy to peanuts: Secondary | ICD-10-CM | POA: Insufficient documentation

## 2018-04-12 DIAGNOSIS — R531 Weakness: Secondary | ICD-10-CM | POA: Insufficient documentation

## 2018-04-12 DIAGNOSIS — Z79899 Other long term (current) drug therapy: Secondary | ICD-10-CM | POA: Insufficient documentation

## 2018-04-12 LAB — CBG MONITORING, ED: GLUCOSE-CAPILLARY: 96 mg/dL (ref 70–99)

## 2018-04-12 NOTE — ED Notes (Signed)
MD Bethann HumbleWenzt notified that pt is waiting for update.

## 2018-04-12 NOTE — ED Notes (Signed)
Patient transported to X-ray 

## 2018-04-12 NOTE — ED Provider Notes (Signed)
[facility] EMERGENCY DEPARTMENT Provider Note   CSN: [id number] Arrival date & time: 04/12/18  2108     History   Chief Complaint Chief Complaint  Patient presents with  . Weakness    HPI Kristine Soto is a 22 y.o. female.  HPI   Presents for evaluation of generalized weakness and a sensation that "my muscles are decaying."  He is also had multiple episodes of falling, 3 times today and multiple times in the last several weeks.  She has to use a cane to walk.  She is a Consulting civil engineer.  She is seeing her PCP regularly.  He has referred her to a neurologist and she has a follow-up appointment and January 2020 for that.  She denies fever, chills, cough or shortness of breath.  She has intermittent hiccups, and they lasted several hours today causing severe shortness of breath.  EMS was called and she was improved by the time EMS arrived.  He was transferred by EMS without treatment.  She follows up with her psychiatric treatment providers regularly.  She takes multiple medications and has been following her treatment protocols.  There are no other known modifying factors.  Past Medical History:  Diagnosis Date  . Allergy    year round  . Anxiety   . Asthma    when younger but 5 years since any wheezing.  . Depression   . Fibromyalgia   . GAD (generalized anxiety disorder)   . Hyperlipidemia   . Multiple allergies   . Palpitations   . Panic disorder   . Seizures ([facility])   . Trigeminy     Patient Active Problem List   Diagnosis Date Noted  . Fibromyalgia 02/17/2017  . Palpitations 12/19/2016  . Atypical chest pain 12/19/2016  . Obstructive sleep apnea 12/19/2016  . Anxiety 12/19/2016  . Panic disorder 09/24/2016  . GAD (generalized anxiety disorder) 09/09/2016  . Mild intermittent asthma 06/03/2015  . Myalgia 05/15/2015  . Chronic fatigue 05/15/2015    History reviewed. No pertinent surgical history.   OB History   None      Home Medications     Prior to Admission medications   Medication Sig Start Date End Date Taking? Authorizing Provider  acetaminophen (TYLENOL) 500 MG tablet Take 500 mg by mouth as needed.     [provider]  albuterol (PROVENTIL HFA;VENTOLIN HFA) 108 (90 Base) MCG/ACT inhaler Inhale into the lungs.    [provider]  albuterol (PROVENTIL HFA;VENTOLIN HFA) 108 (90 Base) MCG/ACT inhaler Inhale 2 puffs into the lungs every 6 (six) hours as needed for wheezing or shortness of breath. 12/15/17   Saguier, Ramon Dredge, PA-C  ALPRAZolam Prudy Feeler) 0.5 MG tablet Take 1 tablet (0.5 mg total) by mouth at bedtime as needed for anxiety. 01/26/18   Saguier, Ramon Dredge, PA-C  ALPRAZolam Prudy Feeler) 0.5 MG tablet 1 tab po a day panic attack/severe anxiety 02/05/18   Saguier, Ramon Dredge, PA-C  azithromycin (ZITHROMAX) 250 MG tablet Take 2 tablets by mouth on day 1, followed by 1 tablet by mouth daily for 4 days. 02/05/18   Saguier, Ramon Dredge, PA-C  baclofen (LIORESAL) 10 MG tablet TAKE 1 TABLET BY MOUTH THREE TIMES A DAY 02/08/18   Saguier, Ramon Dredge, PA-C  beclomethasone (QVAR REDIHALER) 40 MCG/ACT inhaler Inhale 2 puffs into the lungs 2 (two) times daily. 12/15/17   Saguier, Ramon Dredge, PA-C  fluticasone (FLONASE) 50 MCG/ACT nasal spray Place 2 sprays into both nostrils daily. Patient taking differently: Place 2 sprays into both  nostrils as needed.  03/18/17   Saguier, Ramon Dredge, PA-C  gabapentin (NEURONTIN) 100 MG capsule TAKE 1 CAPSULE BY MOUTH TWICE A DAY 02/12/18   Saguier, Ramon Dredge, PA-C  levocetirizine (XYZAL) 5 MG tablet Take 1 tablet (5 mg total) by mouth every evening. 03/18/17   Saguier, Ramon Dredge, PA-C  meloxicam (MOBIC) 7.5 MG tablet Take 1 tablet (7.5 mg total) by mouth daily. 03/18/17   Saguier, Ramon Dredge, PA-C  metoprolol tartrate (LOPRESSOR) 25 MG tablet Take 0.5 tablets (12.5 mg total) by mouth 2 (two) times daily as needed (Palpitations/chest pain). 03/04/18 06/02/18  Georgeanna Lea, MD  pregabalin (LYRICA) 75 MG capsule Take 1 capsule  (75 mg total) by mouth 2 (two) times daily. 02/24/18   Saguier, Ramon Dredge, PA-C  pregabalin (LYRICA) 75 MG capsule Take 1 capsule (75 mg total) by mouth 3 (three) times daily. Can fill when current prescription expires.(not sooner) 03/10/18   Saguier, Ramon Dredge, PA-C  temazepam (RESTORIL) 15 MG capsule TAKE 1 CAPSULE BY MOUTH NIGHTLY AS NEEDED FOR SLEEP 12/19/16   [provider]  venlafaxine XR (EFFEXOR-XR) 75 MG 24 hr capsule Take 1 capsule (75 mg total) by mouth daily with breakfast. 02/05/18   Saguier, Ramon Dredge, PA-C  Vitamin D, Ergocalciferol, (DRISDOL) 50000 units CAPS capsule Take 1 capsule by mouth once a week. 12/29/16   [provider]  Vitamin D, Ergocalciferol, (DRISDOL) 50000 units CAPS capsule Take 1 capsule (50,000 Units total) by mouth every 7 (seven) days. 11/25/17   Saguier, Ramon Dredge, PA-C    Family History Family History  Problem Relation Age of Onset  . Thyroid disease Mother   . Hyperlipidemia Father     Social History Social History   Tobacco Use  . Smoking status: Never Smoker  . Smokeless tobacco: Never Used  Substance Use Topics  . Alcohol use: No  . Drug use: No     Allergies   Peanut oil; Penicillins; Sulfamethoxazole-trimethoprim; Amoxicillin; Egg white  [albumen, egg]; Fish allergy; and Fish-derived products   Review of Systems Review of Systems  All other systems reviewed and are negative.    Physical Exam Updated Vital Signs BP 119/85   Pulse (!) 101   Temp 98.5 F (36.9 C) (Oral)   Resp 16   Ht 5\' 6"  (1.676 m)   Wt 92.5 kg   LMP 03/18/2018   SpO2 98%   BMI 32.93 kg/m   Physical Exam  Constitutional: She is oriented to person, place, and time. She appears well-developed and well-nourished.  HENT:  Head: Normocephalic and atraumatic.  Eyes: Pupils are equal, round, and reactive to light. Conjunctivae and EOM are normal.  Neck: Normal range of motion and phonation normal. Neck supple.  Cardiovascular: Normal rate and regular  rhythm.  Pulmonary/Chest: Effort normal and breath sounds normal. She exhibits no tenderness.  Abdominal: Soft. She exhibits no distension. There is no tenderness. There is no guarding.  Musculoskeletal: Normal range of motion.  Neurological: She is alert and oriented to person, place, and time. No cranial nerve deficit or sensory deficit. She exhibits normal muscle tone. Coordination normal.  No dysarthria, aphasia or nystagmus.  She walks with a normal gait.  There is no ataxia.  Skin: Skin is warm and dry.  Psychiatric: She has a normal mood and affect. Her behavior is normal. Judgment and thought content normal.  Nursing note and vitals reviewed.    ED Treatments / Results  Labs (all labs ordered are listed, but only abnormal results are displayed) Labs Reviewed  CBG  MONITORING, ED    EKG None  Radiology No results found.  Procedures Procedures (including critical care time)  Medications Ordered in ED Medications - No data to display   Initial Impression / Assessment and Plan / ED Course  I have reviewed the triage vital signs and the nursing notes.  Pertinent labs & imaging results that were available during my care of the patient were reviewed by me and considered in my medical decision making (see chart for details).  Clinical Course as of Apr 13 1110  Tue Apr 13, 2018  0016 Normal  Urinalysis, Routine w reflex microscopic(!) [EW]  1110 No infiltrate, images reviewed by me.  DG Chest 2 View [EW]  1110 normal  Magnesium [EW]  1111 normal  Urinalysis, Routine w reflex microscopic(!) [EW]  1111 normal  Comprehensive metabolic panel(!) [EW]  1111 normal  CBC with Differential [EW]    Clinical Course User Index [EW] Mancel BaleWentz, Rayjon Wery, MD    Nursing Notes Reviewed/ Care Coordinated Applicable Imaging Reviewed Interpretation of Laboratory Data incorporated into ED treatment  Disposition- as per Dr. Preston FleetingGlick after MRI imaging.   Final Clinical Impressions(s) /  ED Diagnoses   Final diagnoses:  Weakness    ED Discharge Orders    None       Mancel BaleWentz, Magon Croson, MD 04/13/18 [phone]

## 2018-04-12 NOTE — ED Triage Notes (Signed)
Pt here via [facility], hx of fibromyalgia, reports increased weakness and difficulty walking worse than at baseline x3 weeks, pt has a rheumatologist and neurologist appt scheduled.  Denies any pain.

## 2018-04-12 NOTE — ED Notes (Signed)
Pt ambulated to bathroom without any assistance, no reported issues.

## 2018-04-12 NOTE — ED Notes (Signed)
Pt ambulated to restroom independently upon arrival to provide UA.

## 2018-04-13 ENCOUNTER — Emergency Department (HOSPITAL_COMMUNITY): Payer: BLUE CROSS/BLUE SHIELD

## 2018-04-13 LAB — CBC WITH DIFFERENTIAL/PLATELET
Abs Immature Granulocytes: 0.03 10*3/uL (ref 0.00–0.07)
BASOS ABS: 0 10*3/uL (ref 0.0–0.1)
BASOS PCT: 0 %
EOS ABS: 0.2 10*3/uL (ref 0.0–0.5)
EOS PCT: 3 %
HCT: 40.2 % (ref 36.0–46.0)
Hemoglobin: 12.8 g/dL (ref 12.0–15.0)
Immature Granulocytes: 0 %
Lymphocytes Relative: 40 %
Lymphs Abs: 3.3 10*3/uL (ref 0.7–4.0)
MCH: 29.2 pg (ref 26.0–34.0)
MCHC: 31.8 g/dL (ref 30.0–36.0)
MCV: 91.8 fL (ref 80.0–100.0)
Monocytes Absolute: 0.7 10*3/uL (ref 0.1–1.0)
Monocytes Relative: 9 %
NRBC: 0 % (ref 0.0–0.2)
Neutro Abs: 3.9 10*3/uL (ref 1.7–7.7)
Neutrophils Relative %: 48 %
Platelets: 330 10*3/uL (ref 150–400)
RBC: 4.38 MIL/uL (ref 3.87–5.11)
RDW: 12.7 % (ref 11.5–15.5)
WBC: 8.2 10*3/uL (ref 4.0–10.5)

## 2018-04-13 LAB — COMPREHENSIVE METABOLIC PANEL
ALK PHOS: 56 U/L (ref 38–126)
ALT: 34 U/L (ref 0–44)
AST: 28 U/L (ref 15–41)
Albumin: 3.3 g/dL — ABNORMAL LOW (ref 3.5–5.0)
Anion gap: 7 (ref 5–15)
BUN: 7 mg/dL (ref 6–20)
CALCIUM: 9.1 mg/dL (ref 8.9–10.3)
CHLORIDE: 106 mmol/L (ref 98–111)
CO2: 26 mmol/L (ref 22–32)
CREATININE: 0.73 mg/dL (ref 0.44–1.00)
Glucose, Bld: 93 mg/dL (ref 70–99)
Potassium: 3.9 mmol/L (ref 3.5–5.1)
Sodium: 139 mmol/L (ref 135–145)
Total Bilirubin: 0.3 mg/dL (ref 0.3–1.2)
Total Protein: 6.8 g/dL (ref 6.5–8.1)

## 2018-04-13 LAB — URINALYSIS, ROUTINE W REFLEX MICROSCOPIC
BILIRUBIN URINE: NEGATIVE
Glucose, UA: NEGATIVE mg/dL
Hgb urine dipstick: NEGATIVE
Ketones, ur: NEGATIVE mg/dL
Leukocytes, UA: NEGATIVE
Nitrite: NEGATIVE
Protein, ur: NEGATIVE mg/dL
SPECIFIC GRAVITY, URINE: 1.01 (ref 1.005–1.030)
pH: 5 (ref 5.0–8.0)

## 2018-04-13 LAB — MAGNESIUM: MAGNESIUM: 1.9 mg/dL (ref 1.7–2.4)

## 2018-04-13 NOTE — ED Notes (Signed)
..  Patient verbalizes understanding of discharge instructions. Opportunity for questioning and answers were provided. Armband removed by staff, pt discharged from ED in wheelchair to lobby with family

## 2018-04-13 NOTE — Discharge Instructions (Addendum)
Your tests and scans were all normal. Please follow up with your primary care provider, and with your psychiatrist.

## 2018-04-13 NOTE — ED Notes (Signed)
Patient transported to MRI 

## 2018-04-13 NOTE — ED Provider Notes (Signed)
Care assumed from Dr. Effie Shy, patient presented with weakness and supposed inability to walk but had been observed to ambulate in the ED.  Lab work-up was unremarkable, signed out to me pending MRI of brain.  MRI has come back normal.  Patient is advised of these findings and is referred back to her primary care provider and her psychiatrist.  Results for orders placed or performed during the hospital encounter of 04/12/18  Comprehensive metabolic panel  Result Value Ref Range   Sodium 139 135 - 145 mmol/L   Potassium 3.9 3.5 - 5.1 mmol/L   Chloride 106 98 - 111 mmol/L   CO2 26 22 - 32 mmol/L   Glucose, Bld 93 70 - 99 mg/dL   BUN 7 6 - 20 mg/dL   Creatinine, Ser 0.10 0.44 - 1.00 mg/dL   Calcium 9.1 8.9 - 27.2 mg/dL   Total Protein 6.8 6.5 - 8.1 g/dL   Albumin 3.3 (L) 3.5 - 5.0 g/dL   AST 28 15 - 41 U/L   ALT 34 0 - 44 U/L   Alkaline Phosphatase 56 38 - 126 U/L   Total Bilirubin 0.3 0.3 - 1.2 mg/dL   GFR calc non Af Amer >60 >60 mL/min   GFR calc Af Amer >60 >60 mL/min   Anion gap 7 5 - 15  CBC with Differential  Result Value Ref Range   WBC 8.2 4.0 - 10.5 K/uL   RBC 4.38 3.87 - 5.11 MIL/uL   Hemoglobin 12.8 12.0 - 15.0 g/dL   HCT 53.6 64.4 - 03.4 %   MCV 91.8 80.0 - 100.0 fL   MCH 29.2 26.0 - 34.0 pg   MCHC 31.8 30.0 - 36.0 g/dL   RDW 74.2 59.5 - 63.8 %   Platelets 330 150 - 400 K/uL   nRBC 0.0 0.0 - 0.2 %   Neutrophils Relative % 48 %   Neutro Abs 3.9 1.7 - 7.7 K/uL   Lymphocytes Relative 40 %   Lymphs Abs 3.3 0.7 - 4.0 K/uL   Monocytes Relative 9 %   Monocytes Absolute 0.7 0.1 - 1.0 K/uL   Eosinophils Relative 3 %   Eosinophils Absolute 0.2 0.0 - 0.5 K/uL   Basophils Relative 0 %   Basophils Absolute 0.0 0.0 - 0.1 K/uL   Immature Granulocytes 0 %   Abs Immature Granulocytes 0.03 0.00 - 0.07 K/uL  Magnesium  Result Value Ref Range   Magnesium 1.9 1.7 - 2.4 mg/dL  Urinalysis, Routine w reflex microscopic  Result Value Ref Range   Color, Urine STRAW (A) YELLOW   APPearance CLEAR CLEAR   Specific Gravity, Urine 1.010 1.005 - 1.030   pH 5.0 5.0 - 8.0   Glucose, UA NEGATIVE NEGATIVE mg/dL   Hgb urine dipstick NEGATIVE NEGATIVE   Bilirubin Urine NEGATIVE NEGATIVE   Ketones, ur NEGATIVE NEGATIVE mg/dL   Protein, ur NEGATIVE NEGATIVE mg/dL   Nitrite NEGATIVE NEGATIVE   Leukocytes, UA NEGATIVE NEGATIVE  CBG monitoring, ED  Result Value Ref Range   Glucose-Capillary 96 70 - 99 mg/dL   Dg Chest 2 View  Result Date: 04/12/2018 CLINICAL DATA:  Shortness of breath.  Dizziness. EXAM: CHEST - 2 VIEW COMPARISON:  December 23, 2017 FINDINGS: The heart size and mediastinal contours are within normal limits. Both lungs are clear. The visualized skeletal structures are unremarkable. IMPRESSION: No active cardiopulmonary disease. Electronically Signed   By: Gerome Sam III M.D   On: 04/12/2018 23:40   Mr Brain Wo Contrast (  neuro Protocol)  Result Date: 04/13/2018 CLINICAL DATA:  Increased weakness EXAM: MRI HEAD WITHOUT CONTRAST TECHNIQUE: Multiplanar, multiecho pulse sequences of the brain and surrounding structures were obtained without intravenous contrast. COMPARISON:  None. FINDINGS: BRAIN: There is no acute infarct, acute hemorrhage, hydrocephalus or extra-axial collection. The midline structures are normal. No midline shift or other mass effect. There are no old infarcts. The white matter signal is normal for the patient's age. The cerebral and cerebellar volume are age-appropriate. Susceptibility-sensitive sequences show no chronic microhemorrhage or superficial siderosis. VASCULAR: Major intracranial arterial and venous sinus flow voids are normal. SKULL AND UPPER CERVICAL SPINE: Calvarial bone marrow signal is normal. There is no skull base mass. Visualized upper cervical spine and soft tissues are normal. SINUSES/ORBITS: No fluid levels or advanced mucosal thickening. No mastoid or middle ear effusion. The orbits are normal. IMPRESSION: Normal brain MRI.  Electronically Signed   By: Deatra RobinsonKevin  Herman M.D.   On: 04/13/2018 02:02      Dione BoozeGlick, Mersadie Kavanaugh, MD 04/13/18 [phone]

## 2018-05-07 ENCOUNTER — Telehealth: Payer: Self-pay | Admitting: Medical

## 2018-05-07 NOTE — Telephone Encounter (Signed)
Pt brought me handicap placard. I did not fill this out. Pt is very young and unsure presently if she qualifies for this. Will give forms to jasmine to file and hold. Can discuss with pt later on future visit.

## 2018-05-14 ENCOUNTER — Encounter (HOSPITAL_BASED_OUTPATIENT_CLINIC_OR_DEPARTMENT_OTHER): Payer: Self-pay | Admitting: Emergency Medicine

## 2018-05-14 ENCOUNTER — Other Ambulatory Visit: Payer: Self-pay

## 2018-05-14 ENCOUNTER — Emergency Department (HOSPITAL_BASED_OUTPATIENT_CLINIC_OR_DEPARTMENT_OTHER)
Admission: EM | Admit: 2018-05-14 | Discharge: 2018-05-14 | Disposition: A | Discharge status: Home / Self Care | Payer: BLUE CROSS/BLUE SHIELD | Attending: Emergency Medicine | Admitting: Emergency Medicine

## 2018-05-14 DIAGNOSIS — J45909 Unspecified asthma, uncomplicated: Secondary | ICD-10-CM | POA: Insufficient documentation

## 2018-05-14 DIAGNOSIS — J111 Influenza due to unidentified influenza virus with other respiratory manifestations: Secondary | ICD-10-CM

## 2018-05-14 DIAGNOSIS — Z91010 Allergy to peanuts: Secondary | ICD-10-CM | POA: Insufficient documentation

## 2018-05-14 DIAGNOSIS — J101 Influenza due to other identified influenza virus with other respiratory manifestations: Secondary | ICD-10-CM | POA: Insufficient documentation

## 2018-05-14 DIAGNOSIS — Z79899 Other long term (current) drug therapy: Secondary | ICD-10-CM | POA: Insufficient documentation

## 2018-05-14 DIAGNOSIS — R69 Illness, unspecified: Secondary | ICD-10-CM

## 2018-05-14 DIAGNOSIS — R350 Frequency of micturition: Secondary | ICD-10-CM | POA: Insufficient documentation

## 2018-05-14 LAB — BASIC METABOLIC PANEL
Anion gap: 7 (ref 5–15)
BUN: 12 mg/dL (ref 6–20)
CHLORIDE: 107 mmol/L (ref 98–111)
CO2: 25 mmol/L (ref 22–32)
CREATININE: 0.82 mg/dL (ref 0.44–1.00)
Calcium: 9.5 mg/dL (ref 8.9–10.3)
GFR calc Af Amer: >60 mL/min (ref >60)
GFR calc non Af Amer: >60 mL/min (ref >60)
Glucose, Bld: 133 mg/dL — ABNORMAL HIGH (ref 70–99)
Potassium: 3.4 mmol/L — ABNORMAL LOW (ref 3.5–5.1)
SODIUM: 139 mmol/L (ref 135–145)

## 2018-05-14 LAB — CBC WITH DIFFERENTIAL/PLATELET
ABS IMMATURE GRANULOCYTES: 0.05 10*3/uL (ref 0.00–0.07)
Basophils Absolute: 0 10*3/uL (ref 0.0–0.1)
Basophils Relative: 0 %
EOS PCT: 1 %
Eosinophils Absolute: 0.1 10*3/uL (ref 0.0–0.5)
HEMATOCRIT: 44.5 % (ref 36.0–46.0)
HEMOGLOBIN: 14.5 g/dL (ref 12.0–15.0)
Immature Granulocytes: 0 %
LYMPHS ABS: 0.6 10*3/uL — AB (ref 0.7–4.0)
LYMPHS PCT: 5 %
MCH: 29.8 pg (ref 26.0–34.0)
MCHC: 32.6 g/dL (ref 30.0–36.0)
MCV: 91.6 fL (ref 80.0–100.0)
MONO ABS: 0.9 10*3/uL (ref 0.1–1.0)
MONOS PCT: 8 %
NEUTROS ABS: 10.4 10*3/uL — AB (ref 1.7–7.7)
Neutrophils Relative %: 86 %
Platelets: 299 10*3/uL (ref 150–400)
RBC: 4.86 MIL/uL (ref 3.87–5.11)
RDW: 12.8 % (ref 11.5–15.5)
WBC: 12.1 10*3/uL — AB (ref 4.0–10.5)
nRBC: 0 % (ref 0.0–0.2)

## 2018-05-14 LAB — URINALYSIS, ROUTINE W REFLEX MICROSCOPIC
Bilirubin Urine: NEGATIVE
GLUCOSE, UA: NEGATIVE mg/dL
Hgb urine dipstick: NEGATIVE
Ketones, ur: 15 mg/dL — AB
LEUKOCYTES UA: NEGATIVE
Nitrite: NEGATIVE
PROTEIN: NEGATIVE mg/dL
Specific Gravity, Urine: 1.025 (ref 1.005–1.030)
pH: 5.5 (ref 5.0–8.0)

## 2018-05-14 LAB — PREGNANCY, URINE: PREG TEST UR: NEGATIVE

## 2018-05-14 LAB — INFLUENZA PANEL BY PCR (TYPE A & B)
Influenza A By PCR: NEGATIVE
Influenza B By PCR: POSITIVE — AB

## 2018-05-14 MED ORDER — ONDANSETRON 4 MG PO TBDP: ORAL_TABLET | ORAL | 0 refills | Status: AC

## 2018-05-14 MED ORDER — ACETAMINOPHEN 325 MG PO TABS
650.0000 mg | ORAL_TABLET | Freq: Once | ORAL | Status: DC
  Filled 2018-05-14: qty 2

## 2018-05-14 NOTE — ED Triage Notes (Signed)
flu like symptoms since today. Dry cough.

## 2018-05-14 NOTE — Discharge Instructions (Signed)
Take tylenol 2 pills 4 times a day and motrin 4 pills 3 times a day.  Drink plenty of fluids.  Return for worsening shortness of breath, headache, confusion. Follow up with your family doctor.   

## 2018-05-14 NOTE — ED Notes (Addendum)
Pt refusing treatment per provider. At time of ride's arrival, pt, mother on phone and grandmother at bedside requesting labs, flu swab, and urine tests. States she's spending all this money to be in emergency department and "nothing was done." "You need evidence to make decisions, can't you test me for abnormalities?" Attempted to explain to pt that these tests were not indicated based on her symptoms. Pt continuing to ask for testing. Called Dr. Adela LankFloyd to bedside to eval for further testing.

## 2018-05-14 NOTE — ED Provider Notes (Addendum)
[facility] EMERGENCY DEPARTMENT Provider Note   CSN: [id number] Arrival date & time: 05/14/18  0138     History   Chief Complaint Chief Complaint  Patient presents with  . Influenza    HPI Kristine Soto is a 22 y.o. female.  22 yo F with a chief complaint of cough congestion fever chills myalgias nausea generalized fatigue and abdominal pain.  Going on for couple days.  Sister had a cough but was afebrile.  Denies other similar illness.  She took some Tylenol about 6 hours ago.  Minimal relief.  Then sought further medical care.   Influenza  Presenting symptoms: cough, fever and sore throat   Presenting symptoms: no headaches, no myalgias, no nausea, no rhinorrhea, no shortness of breath and no vomiting   Associated symptoms: chills and nasal congestion   Illness  This is a new problem. The current episode started 2 days ago. The problem occurs constantly. The problem has not changed since onset.Pertinent negatives include no chest pain, no headaches and no shortness of breath. Nothing aggravates the symptoms. Nothing relieves the symptoms. She has tried nothing for the symptoms. The treatment provided no relief.    Past Medical History:  Diagnosis Date  . Allergy    year round  . Anxiety   . Asthma    when younger but 5 years since any wheezing.  . Depression   . Fibromyalgia   . GAD (generalized anxiety disorder)   . Hyperlipidemia   . Multiple allergies   . Palpitations   . Panic disorder   . Seizures (HCC)   . Trigeminy     Patient Active Problem List   Diagnosis Date Noted  . Fibromyalgia 02/17/2017  . Palpitations 12/19/2016  . Atypical chest pain 12/19/2016  . Obstructive sleep apnea 12/19/2016  . Anxiety 12/19/2016  . Panic disorder 09/24/2016  . GAD (generalized anxiety disorder) 09/09/2016  . Mild intermittent asthma 06/03/2015  . Myalgia 05/15/2015  . Chronic fatigue 05/15/2015    History reviewed. No pertinent surgical  history.   OB History   No obstetric history on file.      Home Medications    Prior to Admission medications   Medication Sig Start Date End Date Taking? Authorizing Provider  albuterol (PROVENTIL HFA;VENTOLIN HFA) 108 (90 Base) MCG/ACT inhaler Inhale 2 puffs into the lungs every 6 (six) hours as needed for wheezing or shortness of breath. 12/15/17  Yes Saguier, Ramon DredgeEdward, PA-C  pregabalin (LYRICA) 75 MG capsule Take 1 capsule (75 mg total) by mouth 2 (two) times daily. 02/24/18  Yes Saguier, Ramon DredgeEdward, PA-C  venlafaxine XR (EFFEXOR-XR) 75 MG 24 hr capsule Take 1 capsule (75 mg total) by mouth daily with breakfast. 02/05/18  Yes Saguier, Ramon DredgeEdward, PA-C  ALPRAZolam Prudy Feeler(XANAX) 0.5 MG tablet Take 1 tablet (0.5 mg total) by mouth at bedtime as needed for anxiety. 01/26/18   Saguier, Ramon DredgeEdward, PA-C  baclofen (LIORESAL) 10 MG tablet TAKE 1 TABLET BY MOUTH THREE TIMES A DAY Patient taking differently: Take 10 mg by mouth 3 (three) times daily.  02/08/18   Saguier, Ramon DredgeEdward, PA-C  beclomethasone (QVAR REDIHALER) 40 MCG/ACT inhaler Inhale 2 puffs into the lungs 2 (two) times daily. 12/15/17   Saguier, Ramon DredgeEdward, PA-C  fluticasone (FLONASE) 50 MCG/ACT nasal spray Place 2 sprays into both nostrils daily. Patient taking differently: Place 2 sprays into both nostrils as needed for allergies.  03/18/17   Saguier, Ramon DredgeEdward, PA-C  gabapentin (NEURONTIN) 100 MG capsule TAKE 1 CAPSULE BY MOUTH  TWICE A DAY Patient taking differently: Take 100 mg by mouth 2 (two) times daily.  02/12/18   Saguier, Ramon Dredge, PA-C  levocetirizine (XYZAL) 5 MG tablet Take 1 tablet (5 mg total) by mouth every evening. 03/18/17   Saguier, Ramon Dredge, PA-C  meloxicam (MOBIC) 7.5 MG tablet Take 1 tablet (7.5 mg total) by mouth daily. 03/18/17   Saguier, Ramon Dredge, PA-C  metoprolol tartrate (LOPRESSOR) 25 MG tablet Take 0.5 tablets (12.5 mg total) by mouth 2 (two) times daily as needed (Palpitations/chest pain). 03/04/18 06/02/18  Georgeanna Lea, MD  ondansetron  (ZOFRAN ODT) 4 MG disintegrating tablet 4mg  ODT q4 hours prn nausea/vomit 05/14/18   Melene Plan, DO  temazepam (RESTORIL) 15 MG capsule Take 15 mg by mouth at bedtime as needed for sleep.  12/19/16   [provider]  Vitamin D, Ergocalciferol, (DRISDOL) 50000 units CAPS capsule Take 1 capsule (50,000 Units total) by mouth every 7 (seven) days. 11/25/17   Saguier, Ramon Dredge, PA-C    Family History Family History  Problem Relation Age of Onset  . Thyroid disease Mother   . Hyperlipidemia Father     Social History Social History   Tobacco Use  . Smoking status: Never Smoker  . Smokeless tobacco: Never Used  Substance Use Topics  . Alcohol use: No  . Drug use: No     Allergies   Peanut oil; Penicillins; Sulfamethoxazole-trimethoprim; Amoxicillin; Egg white  [albumen, egg]; Fish allergy; and Fish-derived products   Review of Systems Review of Systems  Constitutional: Positive for chills and fever.  HENT: Positive for congestion and sore throat. Negative for rhinorrhea.   Eyes: Negative for redness and visual disturbance.  Respiratory: Positive for cough. Negative for shortness of breath and wheezing.   Cardiovascular: Negative for chest pain and palpitations.  Gastrointestinal: Negative for nausea and vomiting.  Genitourinary: Negative for dysuria and urgency.  Musculoskeletal: Negative for arthralgias and myalgias.  Skin: Negative for pallor and wound.  Neurological: Negative for dizziness and headaches.     Physical Exam Updated Vital Signs BP 138/71   Pulse (!) 119   Temp (!) 101.3 F (38.5 C) (Oral)   Resp 16   Ht 5\' 6"  (1.676 m)   Wt 95.3 kg   LMP 04/15/2018 (Exact Date)   SpO2 100%   BMI 33.89 kg/m   Physical Exam Vitals signs and nursing note reviewed.  Constitutional:      General: She is not in acute distress.    Appearance: She is well-developed. She is not diaphoretic.  HENT:     Head: Normocephalic and atraumatic.     Comments: Swollen  turbinates, posterior nasal drip, no noted sinus ttp, tm normal bilaterally.   Eyes:     Pupils: Pupils are equal, round, and reactive to light.  Neck:     Musculoskeletal: Normal range of motion and neck supple.  Cardiovascular:     Rate and Rhythm: Regular rhythm. Tachycardia present.     Heart sounds: No murmur. No friction rub. No gallop.   Pulmonary:     Effort: Pulmonary effort is normal.     Breath sounds: No wheezing or rales.  Abdominal:     General: There is no distension.     Palpations: Abdomen is soft.     Tenderness: There is no abdominal tenderness.  Musculoskeletal:        General: No tenderness.  Skin:    General: Skin is warm and dry.  Neurological:     Mental Status: She is  alert and oriented to person, place, and time.  Psychiatric:        Behavior: Behavior normal.      ED Treatments / Results  Labs (all labs ordered are listed, but only abnormal results are displayed) Labs Reviewed - No data to display  EKG None  Radiology No results found.  Procedures Procedures (including critical care time)  Medications Ordered in ED Medications  acetaminophen (TYLENOL) tablet 650 mg (has no administration in time range)     Initial Impression / Assessment and Plan / ED Course  I have reviewed the triage vital signs and the nursing notes.  Pertinent labs & imaging results that were available during my care of the patient were reviewed by me and considered in my medical decision making (see chart for details).     22 yo female with a chief complaint of cough congestion fever chills myalgias.  She is well-appearing and nontoxic there is no bacterial source found on my exam.  She is tachycardic though I suspect this is due to her fever.  Benign abdominal exam.  I offered to treat her supportively here in the ED which she declined.  I discussed the limited research suggesting that Tamiflu is effective and the patient is currently declining that therapy.  She  is upset because I am not doing anything for her currently and would like to file a complaint.  I suggested she call her doctor in the morning.   The patient was being discharged and her grandmother arrived and they called her mother on the phone and asked to speak with me again.  The patient is demanding that blood work and an influenza test to be completed.  She also had some urinary frequency that she did not discuss with me previously that she was upset that no one had asked her about.  I discussed the limited utility in laboratory testing in a young and healthy individual.  The mother had watched some sort of program on TV she thought that the more data they had on her daughter when she had these episodes and the more likely they could find the cause.  Mild leukocytosis with neutrophilic predominance.  Patient has negative urine ~clean.  Metabolic panel with mild elevation of her blood sugar with very mild hypokalemia.  I discussed the results with the family.  Discharged home.    2:02 AM:  I have discussed the diagnosis/risks/treatment options with the patient and believe the pt to be eligible for discharge home to follow-up with PCP. We also discussed returning to the ED immediately if new or worsening sx occur. We discussed the sx which are most concerning (e.g., sudden worsening pain, fever, inability to tolerate by mouth) that necessitate immediate return. Medications administered to the patient during their visit and any new prescriptions provided to the patient are listed below.  Medications given during this visit Medications  acetaminophen (TYLENOL) tablet 650 mg (has no administration in time range)      The patient appears reasonably screen and/or stabilized for discharge and I doubt any other medical condition or other [facility] requiring further screening, evaluation, or treatment in the ED at this time prior to discharge.    Final Clinical Impressions(s) / ED Diagnoses   Final  diagnoses:  Influenza-like illness    ED Discharge Orders         Ordered    ondansetron (ZOFRAN ODT) 4 MG disintegrating tablet     05/14/18 0158  Melene Plan, DO 05/14/18 0202    Melene Plan, DO 05/14/18 [phone]

## 2018-05-14 NOTE — ED Notes (Signed)
ED Provider at bedside. 

## 2018-05-14 NOTE — ED Notes (Signed)
Pt refused tylenol for fever. Provided patient's discharge instructions, waiting for ride.

## 2018-05-27 NOTE — Progress Notes (Deleted)
NEUROLOGY CONSULTATION NOTE  Kristine Soto MRN: [id number] DOB: [birth year]  Referring provider: Mackie Pai, PA-C Primary care provider: Mackie Pai, PA-C  Reason for consult: Intermittent weakness.  Evaluate for MS  HISTORY OF PRESENT ILLNESS: Kristine Soto is a 23 year old female with fibromyalgia who presents for intermittent weakness.  History supplemented by ED and referring provider notes.  Kristine Soto has diagnosis of fibromyalgia.  Lately she has reported progressively worse diffuse pain.  She has associated neck pain and joint pain.  She also has associated fatigue.  ***.  She denies changes in vision or loss of bowel and bladder control.  Due to continued generalized weakness and multiple episodes of falling, she presented to the ED on 04/12/2018 for further evaluation.  In the ED she did not demonstrate any objective weakness or difficulty ambulating.  She had an MRI of the brain with contrast performed, which was personally reviewed and was normal.  Further work-up over the past several months include labs such as sed rate, CRP, rheumatoid factor, ANA, TSH, B12, B1, HLA-B 27 antigen, CBC, CMP, and magnesium, which were all unremarkable.  In July, itamin D was found to be 20.22 and she was started on supplements.  Cervical spine x-ray from 02/24/2018 was personally reviewed and was unremarkable except for reversal of normal cervical lordosis which may be seen in setting of muscle pain.  PAST MEDICAL HISTORY: Past Medical History:  Diagnosis Date  . Allergy    year round  . Anxiety   . Asthma    when younger but 5 years since any wheezing.  . Depression   . Fibromyalgia   . GAD (generalized anxiety disorder)   . Hyperlipidemia   . Multiple allergies   . Palpitations   . Panic disorder   . Seizures (Kristine Soto)   . Trigeminy     PAST SURGICAL HISTORY: No past surgical history on file.  MEDICATIONS: Current Outpatient Medications on File Prior to Visit  Medication  Sig Dispense Refill  . albuterol (PROVENTIL HFA;VENTOLIN HFA) 108 (90 Base) MCG/ACT inhaler Inhale 2 puffs into the lungs every 6 (six) hours as needed for wheezing or shortness of breath. 1 Inhaler 2  . ALPRAZolam (XANAX) 0.5 MG tablet Take 1 tablet (0.5 mg total) by mouth at bedtime as needed for anxiety. 12 tablet 0  . baclofen (LIORESAL) 10 MG tablet TAKE 1 TABLET BY MOUTH THREE TIMES A DAY (Patient taking differently: Take 10 mg by mouth 3 (three) times daily. ) 30 tablet 1  . beclomethasone (QVAR REDIHALER) 40 MCG/ACT inhaler Inhale 2 puffs into the lungs 2 (two) times daily. 10.6 g 0  . fluticasone (FLONASE) 50 MCG/ACT nasal spray Place 2 sprays into both nostrils daily. (Patient taking differently: Place 2 sprays into both nostrils as needed for allergies. ) 16 g 3  . gabapentin (NEURONTIN) 100 MG capsule TAKE 1 CAPSULE BY MOUTH TWICE A DAY (Patient taking differently: Take 100 mg by mouth 2 (two) times daily. ) 60 capsule 1  . levocetirizine (XYZAL) 5 MG tablet Take 1 tablet (5 mg total) by mouth every evening. 90 tablet 3  . meloxicam (MOBIC) 7.5 MG tablet Take 1 tablet (7.5 mg total) by mouth daily. 30 tablet 2  . metoprolol tartrate (LOPRESSOR) 25 MG tablet Take 0.5 tablets (12.5 mg total) by mouth 2 (two) times daily as needed (Palpitations/chest pain). 30 tablet 6  . ondansetron (ZOFRAN ODT) 4 MG disintegrating tablet 90m ODT q4 hours prn nausea/vomit 20 tablet 0  .  pregabalin (LYRICA) 75 MG capsule Take 1 capsule (75 mg total) by mouth 2 (two) times daily. 60 capsule 0  . temazepam (RESTORIL) 15 MG capsule Take 15 mg by mouth at bedtime as needed for sleep.     Marland Kitchen venlafaxine XR (EFFEXOR-XR) 75 MG 24 hr capsule Take 1 capsule (75 mg total) by mouth daily with breakfast. 30 capsule 3  . Vitamin D, Ergocalciferol, (DRISDOL) 50000 units CAPS capsule Take 1 capsule (50,000 Units total) by mouth every 7 (seven) days. 8 capsule 0   No current facility-administered medications on file prior to  visit.     ALLERGIES: Allergies  Allergen Reactions  . Peanut Oil Anaphylaxis  . Penicillins Anaphylaxis    Has patient had a PCN reaction causing immediate rash, facial/tongue/throat swelling, SOB or lightheadedness with hypotension: unknown Has patient had a PCN reaction causing severe rash involving mucus membranes or skin necrosis:unknown Has patient had a PCN reaction that required hospitalization:unknown Has patient had a PCN reaction occurring within the last 10 years: unknown  Never taken If all of the above answers are "NO", then may proceed with Cephalosporin use.   . Sulfamethoxazole-Trimethoprim Swelling  . Amoxicillin Swelling  . Egg White  [Albumen, Egg] Swelling  . Fish Allergy Rash  . Fish-Derived Products Rash    FAMILY HISTORY: Family History  Problem Relation Age of Onset  . Thyroid disease Mother   . Hyperlipidemia Father    ***.  SOCIAL HISTORY: Social History   Socioeconomic History  . Marital status: Single    Spouse name: Not on file  . Number of children: Not on file  . Years of education: Not on file  . Highest education level: Not on file  Occupational History  . Not on file  Social Needs  . Financial resource strain: Not on file  . Food insecurity:    Worry: Not on file    Inability: Not on file  . Transportation needs:    Medical: Not on file    Non-medical: Not on file  Tobacco Use  . Smoking status: Never Smoker  . Smokeless tobacco: Never Used  Substance and Sexual Activity  . Alcohol use: No  . Drug use: No  . Sexual activity: Never    Birth control/protection: None  Lifestyle  . Physical activity:    Days per week: Not on file    Minutes per session: Not on file  . Stress: Not on file  Relationships  . Social connections:    Talks on phone: Not on file    Gets together: Not on file    Attends religious service: Not on file    Active member of club or organization: Not on file    Attends meetings of clubs or  organizations: Not on file    Relationship status: Not on file  . Intimate partner violence:    Fear of current or ex partner: Not on file    Emotionally abused: Not on file    Physically abused: Not on file    Forced sexual activity: Not on file  Other Topics Concern  . Not on file  Social History Narrative  . Not on file    REVIEW OF SYSTEMS: Constitutional: No fevers, chills, or sweats, no generalized fatigue, change in appetite Eyes: No visual changes, double vision, eye pain Ear, nose and throat: No hearing loss, ear pain, nasal congestion, sore throat Cardiovascular: No chest pain, palpitations Respiratory:  No shortness of breath at rest or with exertion,  wheezes GastrointestinaI: No nausea, vomiting, diarrhea, abdominal pain, fecal incontinence Genitourinary:  No dysuria, urinary retention or frequency Musculoskeletal:  No neck pain, back pain Integumentary: No rash, pruritus, skin lesions Neurological: as above Psychiatric: No depression, insomnia, anxiety Endocrine: No palpitations, fatigue, diaphoresis, mood swings, change in appetite, change in weight, increased thirst Hematologic/Lymphatic:  No purpura, petechiae. Allergic/Immunologic: no itchy/runny eyes, nasal congestion, recent allergic reactions, rashes  PHYSICAL EXAM: *** General: No acute distress.  Patient appears ***-groomed.  *** Head:  Normocephalic/atraumatic Eyes:  fundi examined but not visualized Neck: supple, no paraspinal tenderness, full range of motion Back: No paraspinal tenderness Heart: regular rate and rhythm Lungs: Clear to auscultation bilaterally. Vascular: No carotid bruits. Neurological Exam: Mental status: alert and oriented to person, place, and time, recent and remote memory intact, fund of knowledge intact, attention and concentration intact, speech fluent and not dysarthric, language intact. Cranial nerves: CN I: not tested CN II: pupils equal, round and reactive to light, visual  fields intact CN III, IV, VI:  full range of motion, no nystagmus, no ptosis CN V: facial sensation intact CN VII: upper and lower face symmetric CN VIII: hearing intact CN IX, X: gag intact, uvula midline CN XI: sternocleidomastoid and trapezius muscles intact CN XII: tongue midline Bulk & Tone: normal, no fasciculations. Motor:  5/5 throughout *** Sensation:  Pinprick *** temperature *** and vibration sensation intact.  ***. Deep Tendon Reflexes:  2+ throughout, *** toes downgoing.  *** Finger to nose testing:  Without dysmetria.  *** Heel to shin:  Without dysmetria.  *** Gait:  Normal station and stride.  Able to turn and tandem walk. Romberg ***.  IMPRESSION: ***  PLAN: ***  Thank you for allowing me to take part in the care of this patient.  Metta Clines, DO  CC: ***

## 2018-05-31 ENCOUNTER — Ambulatory Visit: Payer: BLUE CROSS/BLUE SHIELD | Admitting: Neurology

## 2018-05-31 ENCOUNTER — Encounter

## 2018-10-06 ENCOUNTER — Other Ambulatory Visit: Payer: Self-pay | Admitting: Medical

## 2018-10-07 ENCOUNTER — Other Ambulatory Visit: Payer: Self-pay

## 2018-10-07 ENCOUNTER — Ambulatory Visit (INDEPENDENT_AMBULATORY_CARE_PROVIDER_SITE_OTHER): Payer: BLUE CROSS/BLUE SHIELD | Admitting: Medical

## 2018-10-07 DIAGNOSIS — F329 Major depressive disorder, single episode, unspecified: Secondary | ICD-10-CM

## 2018-10-07 DIAGNOSIS — M797 Fibromyalgia: Secondary | ICD-10-CM

## 2018-10-07 DIAGNOSIS — F419 Anxiety disorder, unspecified: Secondary | ICD-10-CM

## 2018-10-07 DIAGNOSIS — F32A Depression, unspecified: Secondary | ICD-10-CM

## 2018-10-07 MED ORDER — PREGABALIN 75 MG PO CAPS: 75.0000 mg | ORAL_CAPSULE | Freq: Two times a day (BID) | ORAL | 0 refills | Status: AC

## 2018-10-07 MED ORDER — ALPRAZOLAM 0.5 MG PO TABS: 0.5000 mg | ORAL_TABLET | Freq: Two times a day (BID) | ORAL | 0 refills | Status: AC | PRN

## 2018-10-07 NOTE — Progress Notes (Signed)
Subjective:    Patient ID: Kristine Soto, female    DOB: 16-Jun-1995, 23 y.o.   MRN: [id number]  HPI  Virtual Visit via Video Note  I connected with Kristine Soto on 10/07/18 at  9:00 AM EDT by a video enabled telemedicine application and verified that I am speaking with the correct person using two identifiers.  Location: Patient: home Provider: office. Pt did not check her vitals.   I discussed the limitations of evaluation and management by telemedicine and the availability of in person appointments. The patient expressed understanding and agreed to proceed.  History of Present Illness:   Pt states would like refill of xanax. She states she would use xanax about twice a week and she states did well. Also she is on venlafaxine. Pt will be doing on line classes this summer. She took past semester off. States mood recently better but anxiety is little worse.  She states past 2 month her anxiety seems to getting worse. Describes moderate anxiety but no panic attacks. She has asthma and staying sheltered at home.   Pt has needs refill of lyrica. She states that this does help with her fibromyalgia(states pain controlled recently).  [place]  Observations/Objective: General no acute distress. Pleasant. Normal affect. Normal speech. Lungs- appear unlabored on inspection. Neuro- gross motor function appears intact  Assessment and Plan: Patient has history of depression and anxiety.  Describes it depression is recently controlled and anxiety has been controlled since her last visit but over the last 2 months she reports some increasing anxiety.  On discussion it appears it is related to having to stay home and shelter in place and she is concerned about COVID.  Patient has no current signs symptoms of COVID infection.  However she has concern due to the fact that she has history of asthma.  History of fibromyalgia and recently fibromyalgia is well  controlled.  She is on Lyrica.  She needs refill.  I went ahead and sent in prescription refill of Xanax for her anxiety.  Prescribe 30 tablets.  She does not use medication very frequently and this should last a while.  She has plenty of Effexor presently.  Went ahead and refilled the Lyrica for fibromyalgia.  Explained to patient that since she has moved to [place] and she is on controlled medications that it is best for her to establish care with provider in [place].  She has at least 9month supply of today's medications.  I am going to go ahead and take myself off as her PCP as she is on controlled medications and needs to establish care and in [place].  Follow Up Instructions:    I discussed the assessment and treatment plan with the patient. The patient was provided an opportunity to ask questions and all were answered. The patient agreed with the plan and demonstrated an understanding of the instructions.   The patient was advised to call back or seek an in-person evaluation if the symptoms worsen or if the condition fails to improve as anticipated.     Esperanza RichtersEdward Laporsha Grealish, PA-C   Review of Systems  Constitutional: Negative for chills, fatigue and fever.  HENT: Negative for congestion and ear pain.   Respiratory: Negative for cough, chest tightness and wheezing.   Cardiovascular: Negative for chest pain and palpitations.  Musculoskeletal: Negative for back pain.  Skin: Negative for rash.  Neurological: Negative for dizziness, syncope, weakness and headaches.  Hematological: Negative for adenopathy. Does not  bruise/bleed easily.  Psychiatric/Behavioral: Positive for dysphoric mood. Negative for behavioral problems, sleep disturbance and suicidal ideas. The patient is nervous/anxious.        Objective:   Physical Exam        Assessment & Plan:

## 2018-10-07 NOTE — Patient Instructions (Addendum)
Patient has history of depression and anxiety.  Describes it depression is recently controlled and anxiety has been controlled since her last visit but over the last 2 months she reports some increasing anxiety.  On discussion it appears it is related to having to stay home and shelter in place and she is concerned about COVID.  Patient has no current signs symptoms of COVID infection.  However she has concern due to the fact that she has history of asthma.  History of fibromyalgia and recently fibromyalgia is well controlled.  She is on Lyrica.  She needs refill.  I went ahead and sent in prescription refill of Xanax for her anxiety.  Prescribe 30 tablets.  She does not use medication very frequently and this should last a while.  She has plenty of Effexor presently.  Went ahead and refilled the Lyrica for fibromyalgia.  Explained to patient that since she has moved to [place] and she is on controlled medications that it is best for her to establish care with provider in [place].  She has at least 56month supply of today's medications.  I am going to go ahead and take myself off as her PCP as she is on controlled medications and needs to establish care and in [place]. T

## 2018-12-12 ENCOUNTER — Other Ambulatory Visit: Payer: Self-pay | Admitting: Medical

## 2018-12-27 ENCOUNTER — Other Ambulatory Visit: Payer: Self-pay | Admitting: Medical

## 2019-01-29 ENCOUNTER — Other Ambulatory Visit: Payer: Self-pay | Admitting: Medical

## 2019-06-20 IMAGING — US US TRANSVAGINAL NON-OB
1 series · 14 of 25 positions shown · non-contrast
Comparison: None.

CLINICAL DATA: Pelvic pain x2 days

EXAM:
TRANSABDOMINAL AND TRANSVAGINAL ULTRASOUND OF PELVIS
DOPPLER ULTRASOUND OF OVARIES
TECHNIQUE: Both transabdominal and transvaginal ultrasound examinations of the
pelvis were performed. Transabdominal technique was performed for
global imaging of the pelvis including uterus, ovaries, adnexal
regions, and pelvic cul-de-sac.
It was necessary to proceed with endovaginal exam following the
transabdominal exam to visualize the uterus and bilateral ovaries.
Color and duplex Doppler ultrasound was utilized to evaluate blood
flow to the ovaries.

[Series 1: us transvaginal non-ob · 0.21mm/px · 14 of 35 slices shown]
[im 1/35]
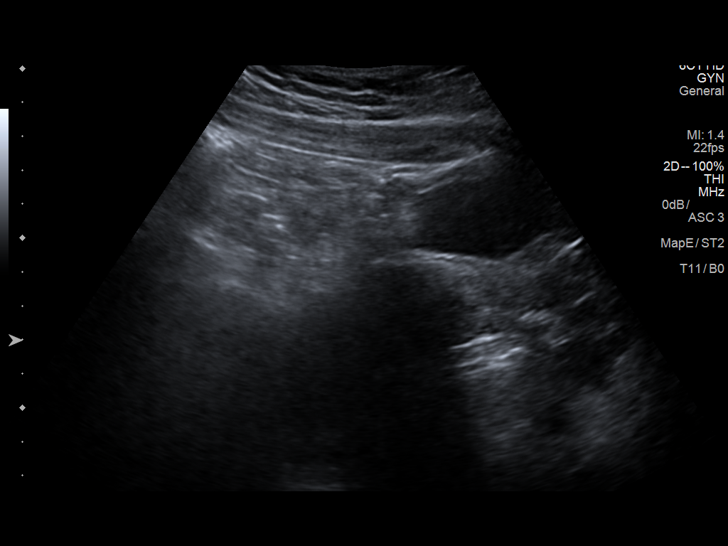
[im 3/35]
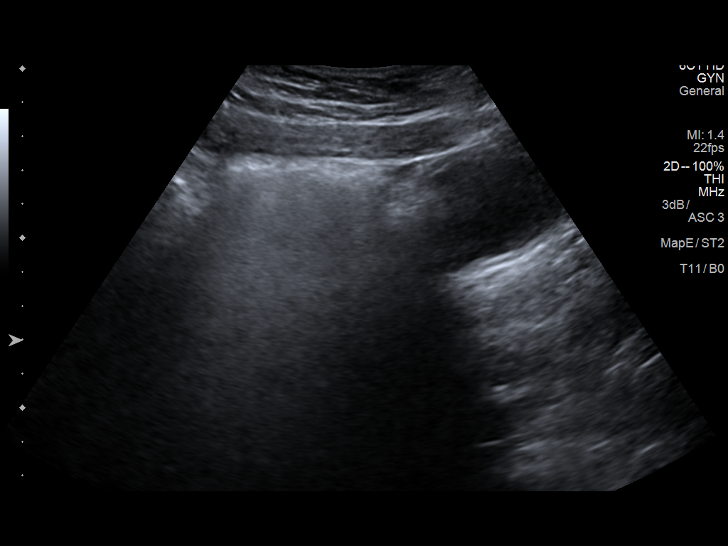
[im 6/35]
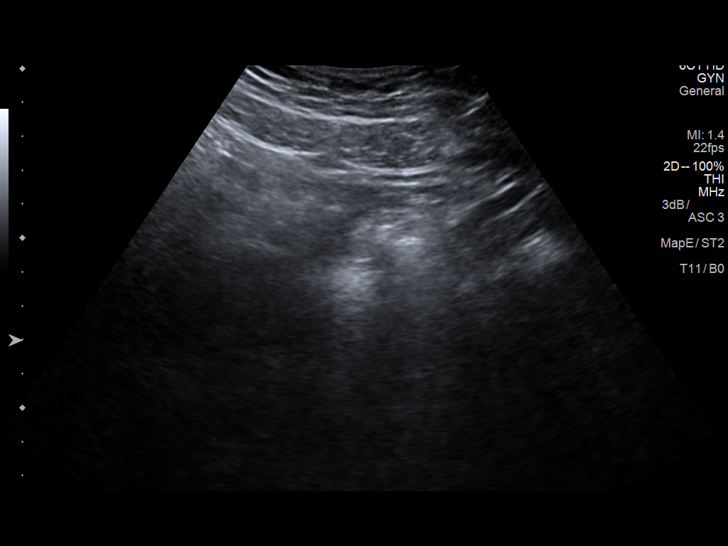
[im 9/35]
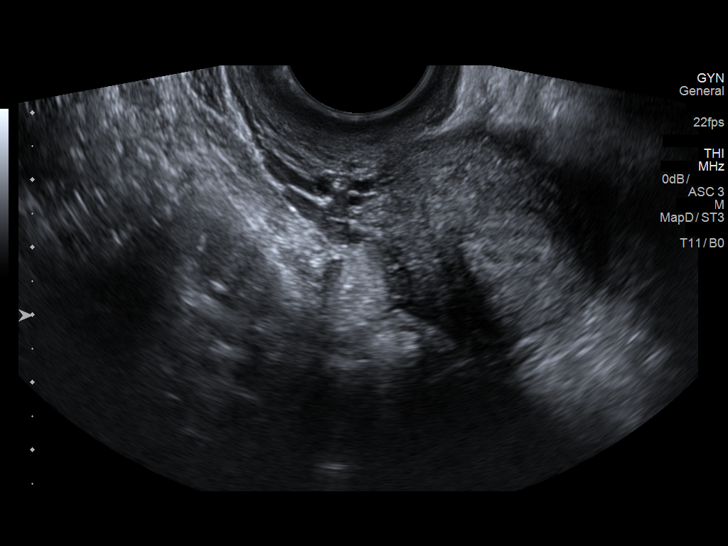
[im 12/35]
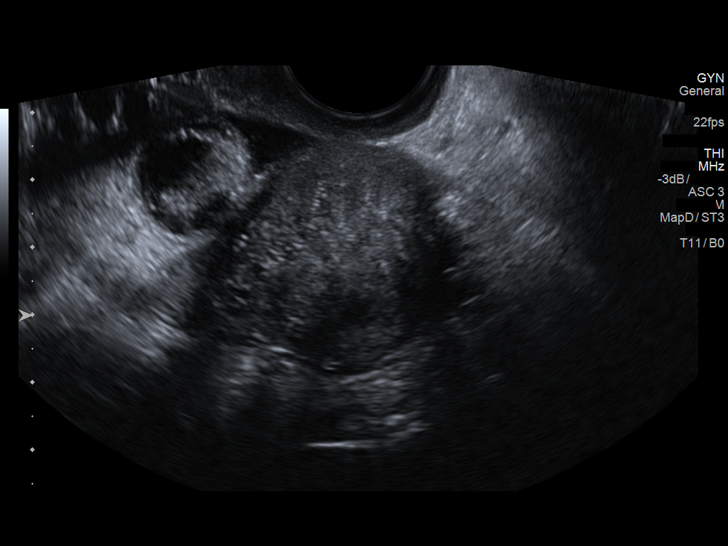
[im 13/35]
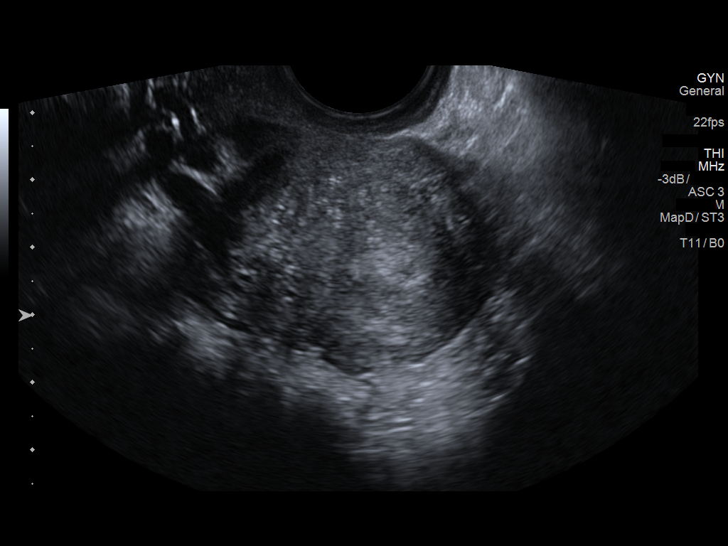
[im 16/35]
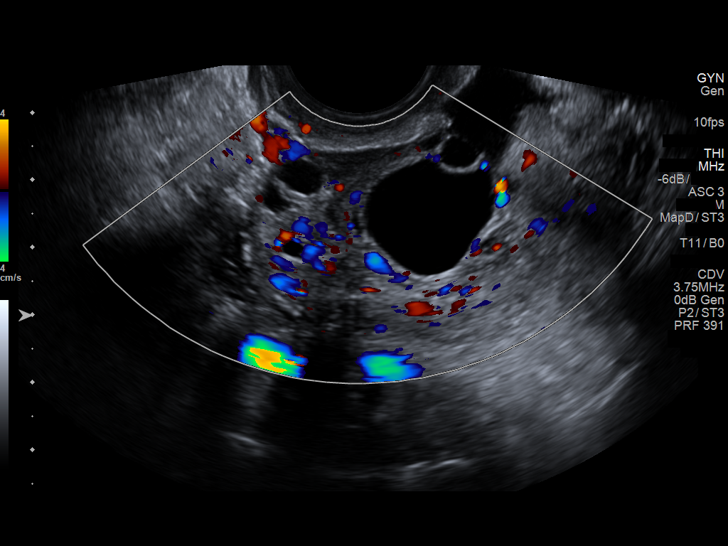
[im 19/35]
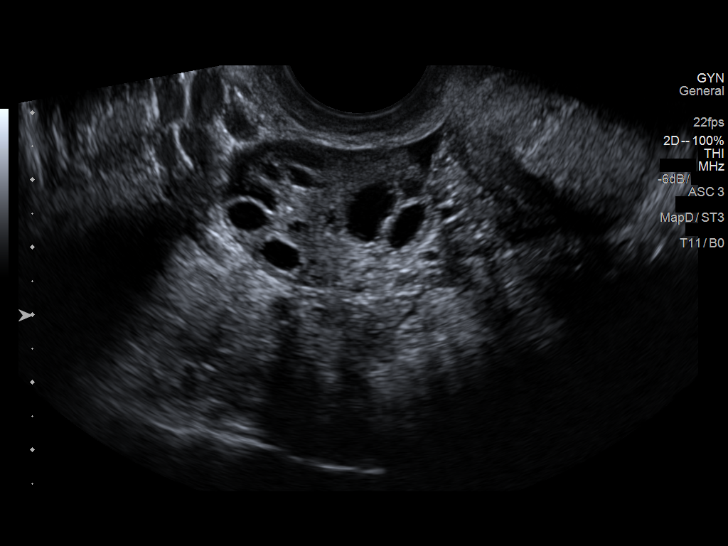
[im 22/35]
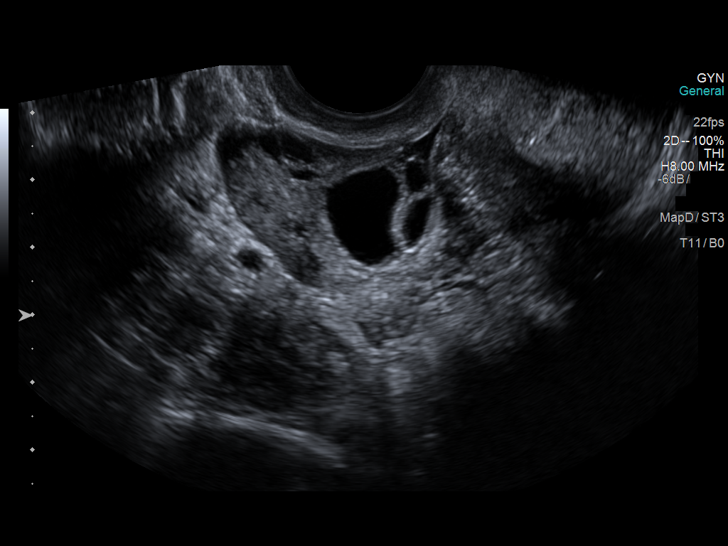
[im 23/35]
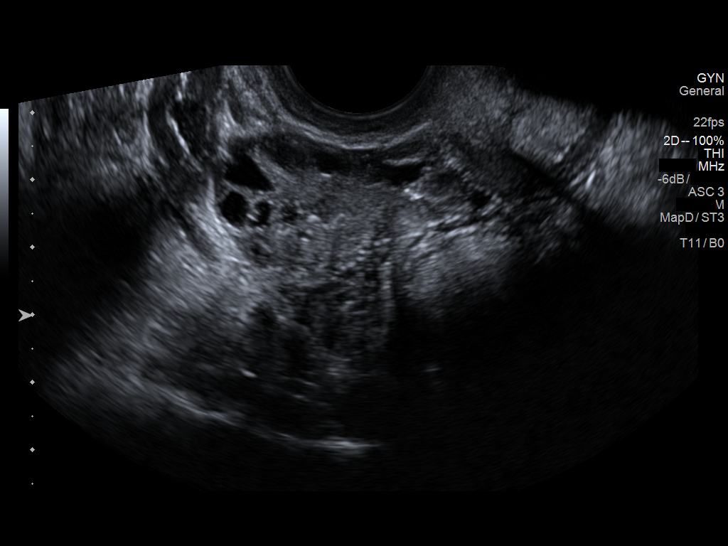
[im 26/35]
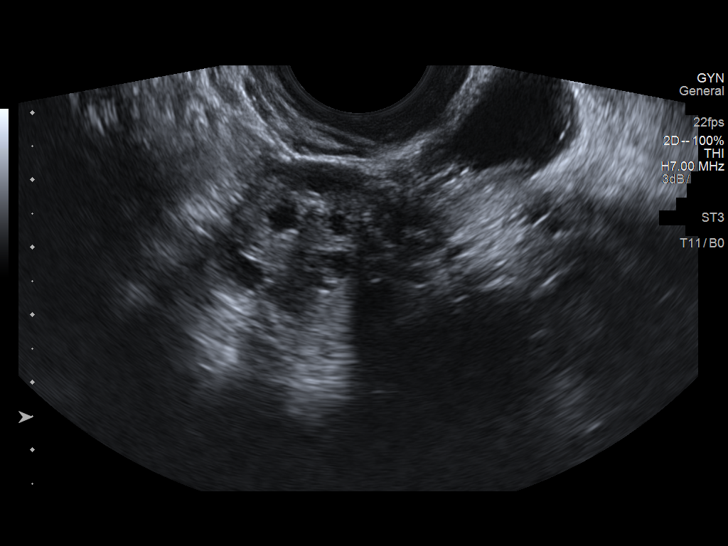
[im 29/35]
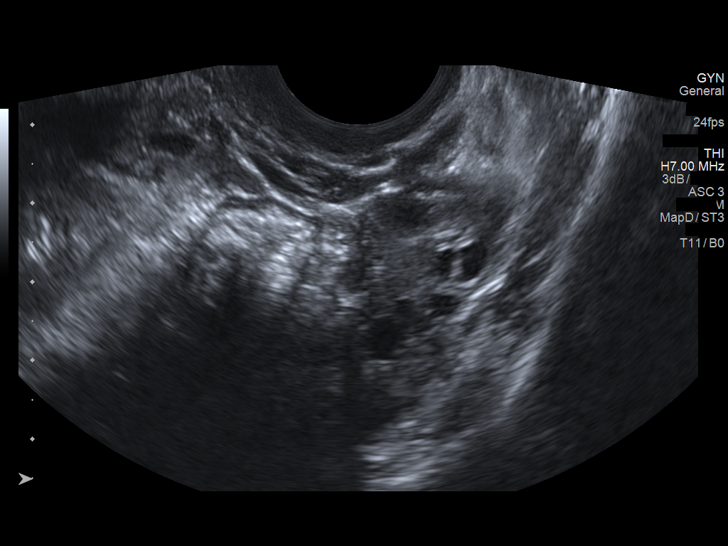
[im 32/35]
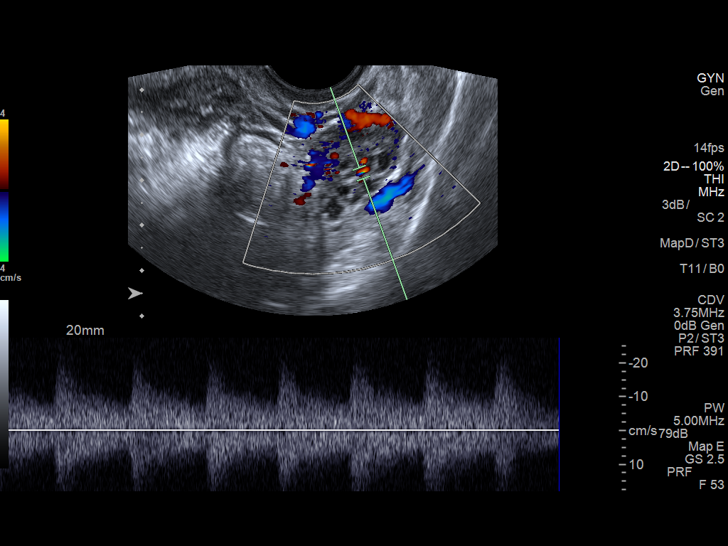
[im 35/35]
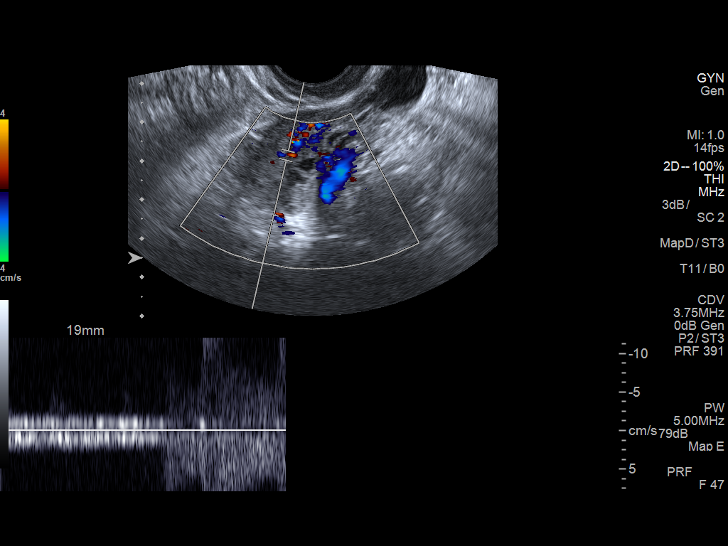

[14 of 25 positions shown; findings below may reference images not displayed]

FINDINGS: Uterus

Measurements: 6.2 x 3.5 x 3.9 cm. Retroverted. No fibroids or other
mass visualized.

Endometrium

Thickness: 9 mm.  No focal abnormality visualized.

Right ovary

Measurements: 3.2 x 2.2 x 3.0 cm. Normal appearance/no adnexal mass.

Left ovary

Measurements: 3.2 x 1.8 x 1.9 cm. Normal appearance/no adnexal mass.

Pulsed Doppler evaluation of both ovaries demonstrates normal
low-resistance arterial and venous waveforms.

Other findings

Trace pelvic fluid.
IMPRESSION: Negative pelvic ultrasound.

No evidence of ovarian torsion.

## 2019-07-03 IMAGING — DX DG CERVICAL SPINE COMPLETE 4+V
7 series · 7 of 7 positions shown · non-contrast
Comparison: None.

CLINICAL DATA: Intermittent posterior cervical spine. Pain
increased 3 days ago.

EXAM:
CERVICAL SPINE - COMPLETE 4+ VIEW

[c-spine lat]
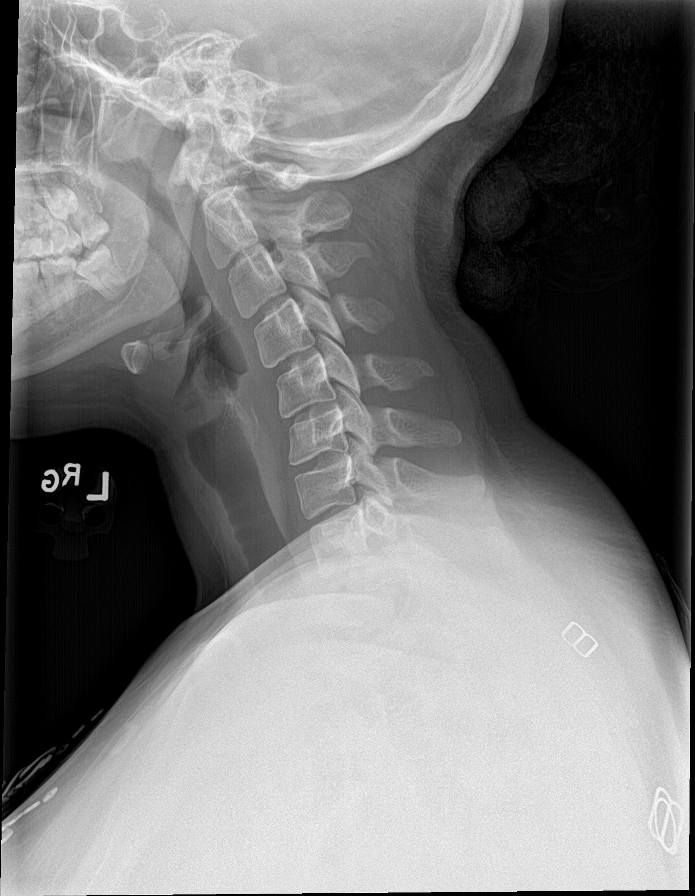

[c-spine obl (1 of 2)]
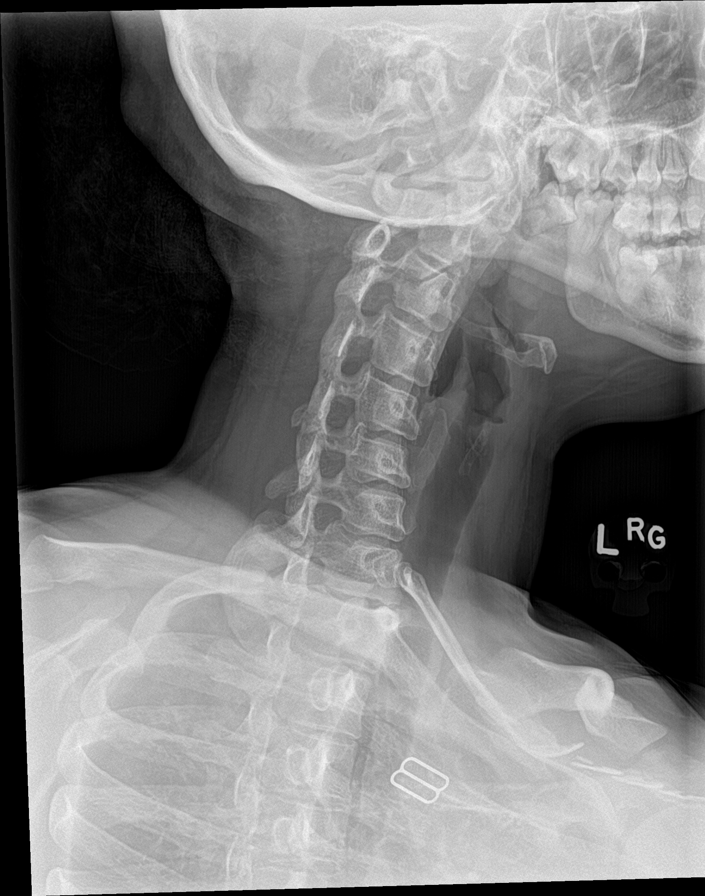

[c-spine obl (2 of 2)]
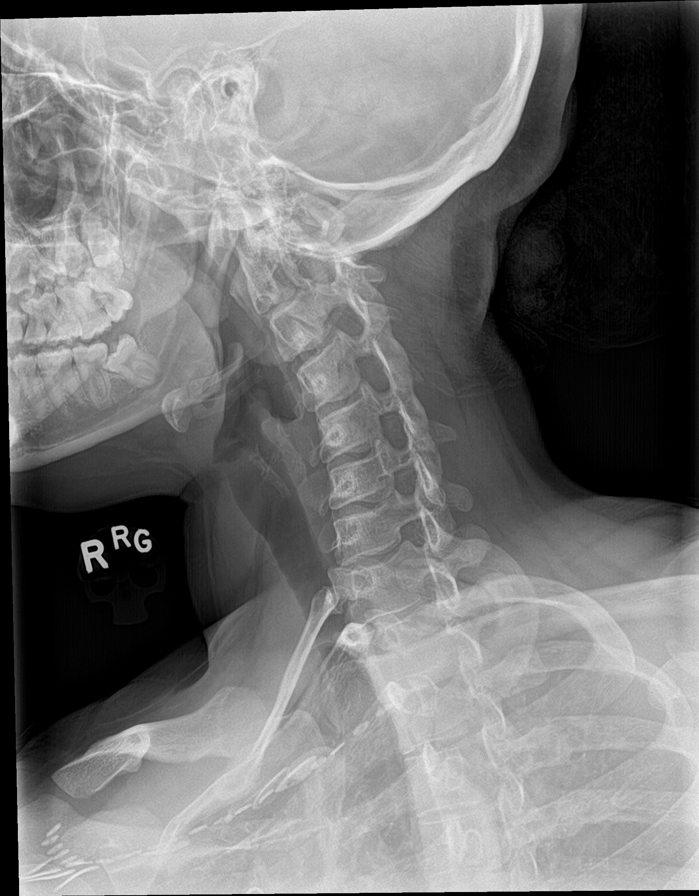

[c-spine ap]
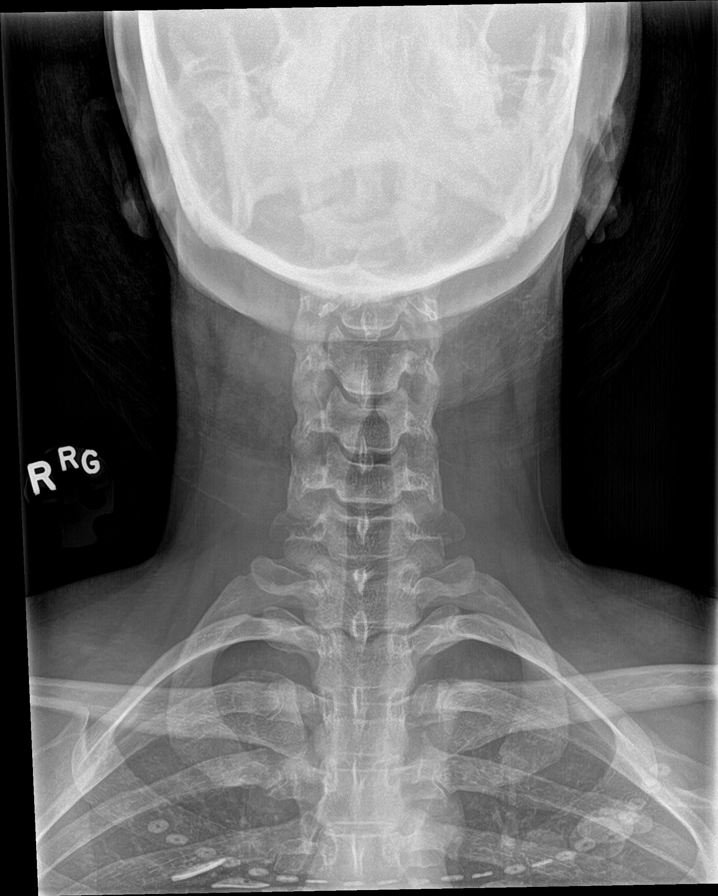

[c-spine open mouth]
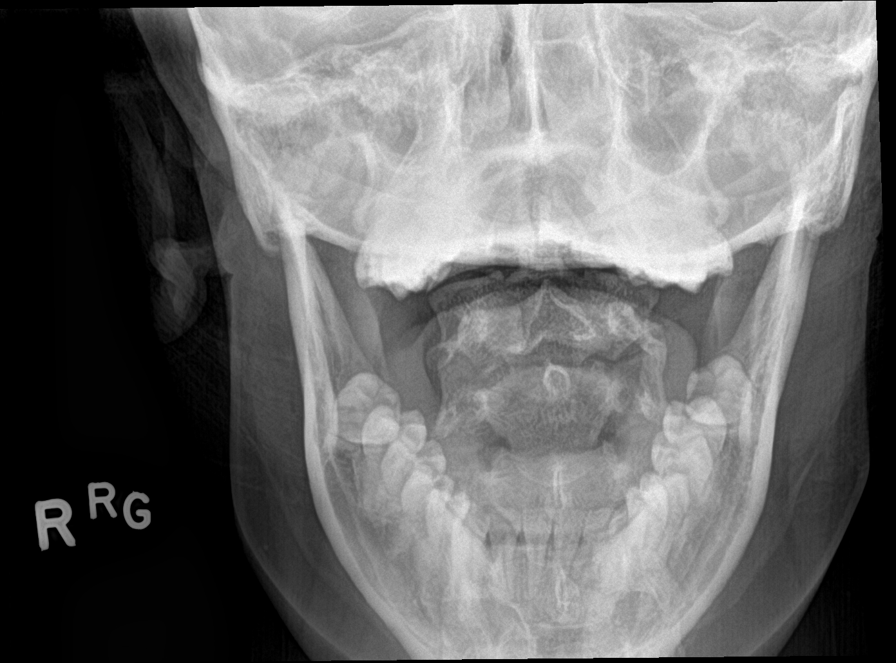

[c-spine swimmers]
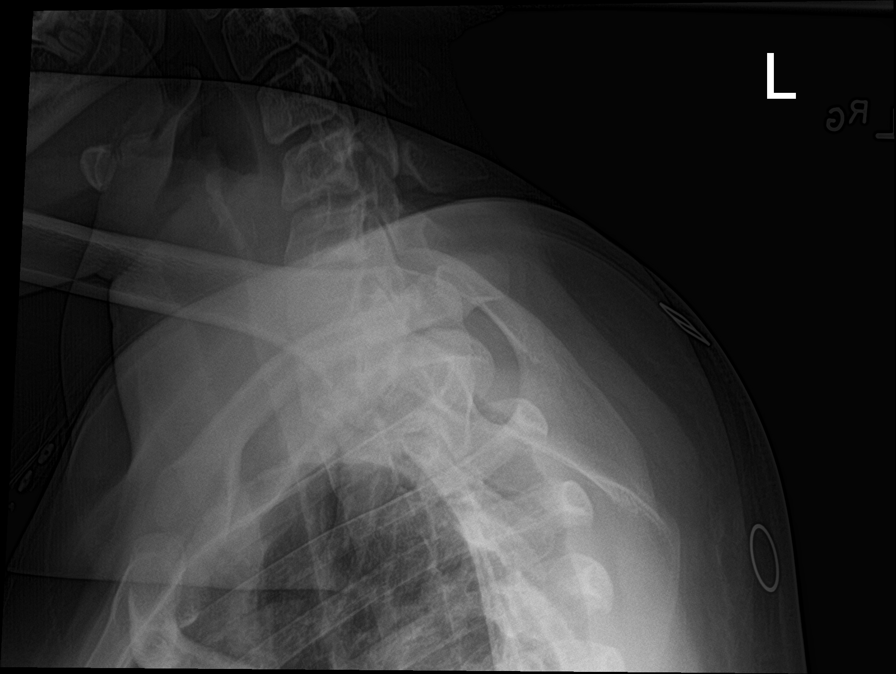

[[person_name]]
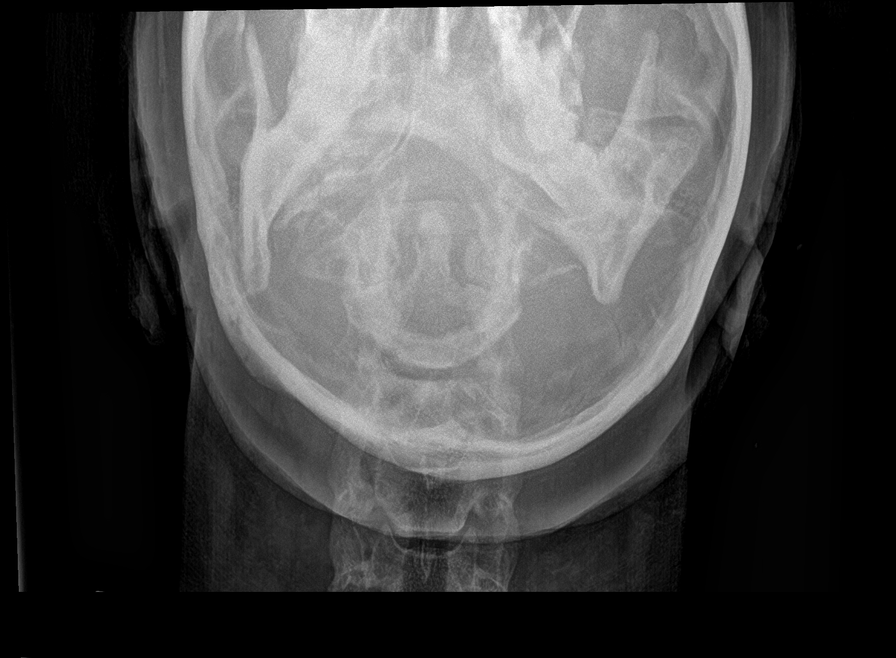

[7 of 7 positions shown; findings below may reference images not displayed]

FINDINGS: There is reversal of normal cervical lordosis. AP alignment is
anatomic. Vertebral body heights are normal. Prevertebral soft.
Foramina are patent bilaterally. The lung apices are clear
bilaterally.
IMPRESSION: 1. Reversal of the normal cervical lordosis normal alignment acute
osseous abnormality. This is nonspecific, but can be seen in the
setting of muscle pain or strain.
2. Otherwise negative cervical spine radiographs.

## 2021-05-28 ENCOUNTER — Ambulatory Visit: Payer: Self-pay | Admitting: Allergy & Immunology
